# Patient Record
Sex: Female | Born: 1935 | Race: White | Hispanic: No | Marital: Single
Health system: Southern US, Community
[De-identification: ages and names within clinical notes are randomized; demographics above are authoritative.]

---

## 2012-12-30 ENCOUNTER — Inpatient Hospital Stay: Payer: Self-pay | Admitting: Internal Medicine

## 2012-12-30 LAB — CBC WITH DIFFERENTIAL/PLATELET
Eosinophil #: 0 10*3/uL (ref 0.0–0.7)
Eosinophil %: 0.2 %
HGB: 12.3 g/dL (ref 12.0–16.0)
Lymphocyte #: 0.6 10*3/uL — ABNORMAL LOW (ref 1.0–3.6)
MCH: 31.5 pg (ref 26.0–34.0)
Neutrophil #: 16.9 10*3/uL — ABNORMAL HIGH (ref 1.4–6.5)
Platelet: 255 10*3/uL (ref 150–440)
RBC: 3.9 10*6/uL (ref 3.80–5.20)
RDW: 13.9 % (ref 11.5–14.5)
WBC: 19 10*3/uL — ABNORMAL HIGH (ref 3.6–11.0)

## 2012-12-30 LAB — URINALYSIS, COMPLETE
Bilirubin,UR: NEGATIVE
Blood: NEGATIVE
Glucose,UR: NEGATIVE mg/dL (ref 0–75)
Leukocyte Esterase: NEGATIVE
Ph: 8 (ref 4.5–8.0)
Protein: NEGATIVE
RBC,UR: <1 /HPF (ref 0–5)
Specific Gravity: 1.009 (ref 1.003–1.030)

## 2012-12-30 LAB — BASIC METABOLIC PANEL
Anion Gap: 8 (ref 7–16)
Calcium, Total: 9.2 mg/dL (ref 8.5–10.1)
Chloride: 98 mmol/L (ref 98–107)
Co2: 28 mmol/L (ref 21–32)
EGFR (African American): >60
Glucose: 97 mg/dL (ref 65–99)
Osmolality: 268 (ref 275–301)
Potassium: 3.7 mmol/L (ref 3.5–5.1)

## 2012-12-31 LAB — CBC WITH DIFFERENTIAL/PLATELET
Basophil #: 0 10*3/uL (ref 0.0–0.1)
Basophil %: 0.4 %
Eosinophil #: 0.1 10*3/uL (ref 0.0–0.7)
Eosinophil %: 0.6 %
HCT: 30.8 % — ABNORMAL LOW (ref 35.0–47.0)
HGB: 10.7 g/dL — ABNORMAL LOW (ref 12.0–16.0)
Lymphocyte #: 1.4 10*3/uL (ref 1.0–3.6)
Lymphocyte %: 10.3 %
MCH: 32 pg (ref 26.0–34.0)
MCHC: 34.9 g/dL (ref 32.0–36.0)
MCV: 92 fL (ref 80–100)
Monocyte #: 1.7 x10 3/mm — ABNORMAL HIGH (ref 0.2–0.9)
Monocyte %: 12.4 %
Neutrophil #: 10.2 10*3/uL — ABNORMAL HIGH (ref 1.4–6.5)
Neutrophil %: 76.3 %
Platelet: 226 10*3/uL (ref 150–440)
RBC: 3.36 10*6/uL — ABNORMAL LOW (ref 3.80–5.20)
RDW: 13.6 % (ref 11.5–14.5)
WBC: 13.3 10*3/uL — ABNORMAL HIGH (ref 3.6–11.0)

## 2012-12-31 LAB — BASIC METABOLIC PANEL
Anion Gap: 6 — ABNORMAL LOW (ref 7–16)
Calcium, Total: 8.1 mg/dL — ABNORMAL LOW (ref 8.5–10.1)
Chloride: 101 mmol/L (ref 98–107)
EGFR (African American): >60
Glucose: 84 mg/dL (ref 65–99)
Osmolality: 267 (ref 275–301)
Potassium: 3.2 mmol/L — ABNORMAL LOW (ref 3.5–5.1)
Sodium: 134 mmol/L — ABNORMAL LOW (ref 136–145)

## 2013-01-05 LAB — CULTURE, BLOOD (SINGLE)

## 2013-03-19 LAB — URINALYSIS, COMPLETE
Ph: 6 (ref 4.5–8.0)
Specific Gravity: 1.014 (ref 1.003–1.030)

## 2013-03-19 LAB — COMPREHENSIVE METABOLIC PANEL
Albumin: 3.8 g/dL (ref 3.4–5.0)
Alkaline Phosphatase: 104 U/L (ref 50–136)
Anion Gap: 9 (ref 7–16)
Bilirubin,Total: 0.4 mg/dL (ref 0.2–1.0)
Calcium, Total: 9.1 mg/dL (ref 8.5–10.1)
Chloride: 97 mmol/L — ABNORMAL LOW (ref 98–107)
Co2: 27 mmol/L (ref 21–32)
Creatinine: 0.87 mg/dL (ref 0.60–1.30)
EGFR (African American): >60
Glucose: 117 mg/dL — ABNORMAL HIGH (ref 65–99)
Potassium: 3.9 mmol/L (ref 3.5–5.1)
SGOT(AST): 29 U/L (ref 15–37)
SGPT (ALT): 15 U/L (ref 12–78)
Sodium: 133 mmol/L — ABNORMAL LOW (ref 136–145)
Total Protein: 7.2 g/dL (ref 6.4–8.2)

## 2013-03-19 LAB — CBC WITH DIFFERENTIAL/PLATELET
Eosinophil #: 0.3 10*3/uL (ref 0.0–0.7)
HGB: 12.5 g/dL (ref 12.0–16.0)
MCH: 31.3 pg (ref 26.0–34.0)
MCHC: 34.5 g/dL (ref 32.0–36.0)
Monocyte #: 1 x10 3/mm — ABNORMAL HIGH (ref 0.2–0.9)
Neutrophil #: 10.2 10*3/uL — ABNORMAL HIGH (ref 1.4–6.5)
Neutrophil %: 77.7 %
RBC: 4.01 10*6/uL (ref 3.80–5.20)
RDW: 13.4 % (ref 11.5–14.5)

## 2013-03-19 LAB — PROTIME-INR
INR: 1
Prothrombin Time: 13.4 secs (ref 11.5–14.7)

## 2013-03-20 ENCOUNTER — Inpatient Hospital Stay: Payer: Self-pay | Admitting: Student

## 2013-03-20 LAB — CBC WITH DIFFERENTIAL/PLATELET
Basophil #: 0.1 10*3/uL (ref 0.0–0.1)
Basophil %: 0.6 %
Eosinophil %: 0.1 %
Lymphocyte %: 6.9 %
MCHC: 34.2 g/dL (ref 32.0–36.0)
MCV: 91 fL (ref 80–100)
Monocyte %: 5.7 %
Neutrophil #: 12 10*3/uL — ABNORMAL HIGH (ref 1.4–6.5)
Neutrophil %: 86.7 %

## 2013-03-20 LAB — BASIC METABOLIC PANEL
Anion Gap: 6 — ABNORMAL LOW (ref 7–16)
BUN: 12 mg/dL (ref 7–18)
Co2: 25 mmol/L (ref 21–32)
Creatinine: 0.77 mg/dL (ref 0.60–1.30)
EGFR (African American): >60
EGFR (Non-African Amer.): >60
Glucose: 146 mg/dL — ABNORMAL HIGH (ref 65–99)
Potassium: 4.3 mmol/L (ref 3.5–5.1)

## 2013-03-21 LAB — CBC WITH DIFFERENTIAL/PLATELET
Basophil %: 0.2 %
Basophil %: 0.4 %
Eosinophil %: 0.1 %
Eosinophil %: 0.2 %
HCT: 28.4 % — ABNORMAL LOW (ref 35.0–47.0)
HGB: 9 g/dL — ABNORMAL LOW (ref 12.0–16.0)
Lymphocyte #: 0.9 10*3/uL — ABNORMAL LOW (ref 1.0–3.6)
MCH: 31.4 pg (ref 26.0–34.0)
MCHC: 34.1 g/dL (ref 32.0–36.0)
Monocyte #: 1.6 x10 3/mm — ABNORMAL HIGH (ref 0.2–0.9)
Monocyte #: 1.8 x10 3/mm — ABNORMAL HIGH (ref 0.2–0.9)
Monocyte %: 13.1 %
Neutrophil #: 9 10*3/uL — ABNORMAL HIGH (ref 1.4–6.5)
Neutrophil %: 80.3 %
Platelet: 176 10*3/uL (ref 150–440)
RBC: 3.12 10*6/uL — ABNORMAL LOW (ref 3.80–5.20)
RDW: 13.7 % (ref 11.5–14.5)
WBC: 14.1 10*3/uL — ABNORMAL HIGH (ref 3.6–11.0)

## 2013-03-21 LAB — BASIC METABOLIC PANEL
Anion Gap: 5 — ABNORMAL LOW (ref 7–16)
Anion Gap: 6 — ABNORMAL LOW (ref 7–16)
BUN: 10 mg/dL (ref 7–18)
BUN: 11 mg/dL (ref 7–18)
Calcium, Total: 8 mg/dL — ABNORMAL LOW (ref 8.5–10.1)
Chloride: 98 mmol/L (ref 98–107)
Chloride: 99 mmol/L (ref 98–107)
Co2: 28 mmol/L (ref 21–32)
EGFR (African American): >60
EGFR (African American): >60
EGFR (Non-African Amer.): >60
Glucose: 114 mg/dL — ABNORMAL HIGH (ref 65–99)
Osmolality: 263 (ref 275–301)
Osmolality: 267 (ref 275–301)
Potassium: 3.8 mmol/L (ref 3.5–5.1)
Potassium: 4.3 mmol/L (ref 3.5–5.1)
Sodium: 131 mmol/L — ABNORMAL LOW (ref 136–145)
Sodium: 133 mmol/L — ABNORMAL LOW (ref 136–145)

## 2013-03-21 LAB — URINE CULTURE

## 2013-03-22 ENCOUNTER — Ambulatory Visit: Payer: Self-pay | Admitting: Internal Medicine

## 2013-03-22 LAB — CBC WITH DIFFERENTIAL/PLATELET
Basophil %: 0.3 %
Eosinophil #: 0 10*3/uL (ref 0.0–0.7)
Eosinophil %: 0.1 %
HCT: 25.1 % — ABNORMAL LOW (ref 35.0–47.0)
HGB: 8.5 g/dL — ABNORMAL LOW (ref 12.0–16.0)
Lymphocyte #: 0.6 10*3/uL — ABNORMAL LOW (ref 1.0–3.6)
Lymphocyte %: 4.3 %
MCH: 30.7 pg (ref 26.0–34.0)
MCHC: 34.1 g/dL (ref 32.0–36.0)
Monocyte #: 1.8 x10 3/mm — ABNORMAL HIGH (ref 0.2–0.9)
Neutrophil #: 11.6 10*3/uL — ABNORMAL HIGH (ref 1.4–6.5)
Neutrophil %: 82.2 %
RBC: 2.79 10*6/uL — ABNORMAL LOW (ref 3.80–5.20)

## 2013-03-22 LAB — HEMOGLOBIN: HGB: 8.6 g/dL — ABNORMAL LOW (ref 12.0–16.0)

## 2013-03-23 LAB — BASIC METABOLIC PANEL
BUN: 12 mg/dL (ref 7–18)
Calcium, Total: 7.7 mg/dL — ABNORMAL LOW (ref 8.5–10.1)
Chloride: 94 mmol/L — ABNORMAL LOW (ref 98–107)
Creatinine: 0.83 mg/dL (ref 0.60–1.30)
EGFR (African American): >60
EGFR (Non-African Amer.): >60
Glucose: 137 mg/dL — ABNORMAL HIGH (ref 65–99)
Potassium: 2.9 mmol/L — ABNORMAL LOW (ref 3.5–5.1)
Sodium: 127 mmol/L — ABNORMAL LOW (ref 136–145)

## 2013-03-23 LAB — CBC WITH DIFFERENTIAL/PLATELET
Basophil #: 0 10*3/uL (ref 0.0–0.1)
Basophil %: 0.2 %
Eosinophil %: 1.3 %
Lymphocyte #: 1.4 10*3/uL (ref 1.0–3.6)
Monocyte #: 2 x10 3/mm — ABNORMAL HIGH (ref 0.2–0.9)
Monocyte %: 17.1 %
Neutrophil #: 8.1 10*3/uL — ABNORMAL HIGH (ref 1.4–6.5)
Neutrophil %: 69.2 %
Platelet: 152 10*3/uL (ref 150–440)
RBC: 2.51 10*6/uL — ABNORMAL LOW (ref 3.80–5.20)
RDW: 13.4 % (ref 11.5–14.5)

## 2013-03-24 LAB — BASIC METABOLIC PANEL
Calcium, Total: 7.9 mg/dL — ABNORMAL LOW (ref 8.5–10.1)
Chloride: 95 mmol/L — ABNORMAL LOW (ref 98–107)
Co2: 27 mmol/L (ref 21–32)
Creatinine: 0.79 mg/dL (ref 0.60–1.30)
Glucose: 107 mg/dL — ABNORMAL HIGH (ref 65–99)
Potassium: 3.3 mmol/L — ABNORMAL LOW (ref 3.5–5.1)
Sodium: 128 mmol/L — ABNORMAL LOW (ref 136–145)

## 2013-03-24 LAB — CBC WITH DIFFERENTIAL/PLATELET
Basophil %: 0.5 %
Eosinophil #: 0.3 10*3/uL (ref 0.0–0.7)
HCT: 23.6 % — ABNORMAL LOW (ref 35.0–47.0)
Lymphocyte #: 1.2 10*3/uL (ref 1.0–3.6)
MCH: 31.5 pg (ref 26.0–34.0)
MCHC: 34.6 g/dL (ref 32.0–36.0)
Monocyte #: 1.8 x10 3/mm — ABNORMAL HIGH (ref 0.2–0.9)
Monocyte %: 15.8 %
Neutrophil %: 70.8 %
Platelet: 209 10*3/uL (ref 150–440)
RBC: 2.6 10*6/uL — ABNORMAL LOW (ref 3.80–5.20)
RDW: 13.5 % (ref 11.5–14.5)
WBC: 11.2 10*3/uL — ABNORMAL HIGH (ref 3.6–11.0)

## 2013-03-25 LAB — CBC WITH DIFFERENTIAL/PLATELET
Eosinophil #: 0.4 10*3/uL (ref 0.0–0.7)
Eosinophil %: 4.6 %
HCT: 22.6 % — ABNORMAL LOW (ref 35.0–47.0)
Lymphocyte #: 1.5 10*3/uL (ref 1.0–3.6)
Monocyte #: 1.3 x10 3/mm — ABNORMAL HIGH (ref 0.2–0.9)
Monocyte %: 13.5 %
Neutrophil %: 65.5 %
Platelet: 232 10*3/uL (ref 150–440)
WBC: 9.7 10*3/uL (ref 3.6–11.0)

## 2013-03-25 LAB — BASIC METABOLIC PANEL
Anion Gap: 5 — ABNORMAL LOW (ref 7–16)
BUN: 10 mg/dL (ref 7–18)
EGFR (African American): >60
EGFR (Non-African Amer.): >60
Osmolality: 267 (ref 275–301)
Potassium: 3.6 mmol/L (ref 3.5–5.1)

## 2013-03-27 ENCOUNTER — Ambulatory Visit: Payer: Self-pay | Admitting: Internal Medicine

## 2013-03-27 LAB — CULTURE, BLOOD (SINGLE)

## 2013-09-11 ENCOUNTER — Emergency Department: Payer: Self-pay | Admitting: Emergency Medicine

## 2013-09-11 LAB — CBC WITH DIFFERENTIAL/PLATELET
Basophil #: 0 10*3/uL (ref 0.0–0.1)
Basophil %: 0.3 %
Eosinophil #: 0 10*3/uL (ref 0.0–0.7)
Eosinophil %: 0 %
HCT: 39.1 % (ref 35.0–47.0)
HGB: 12.8 g/dL (ref 12.0–16.0)
LYMPHS ABS: 0.8 10*3/uL — AB (ref 1.0–3.6)
LYMPHS PCT: 5.1 %
MCH: 31 pg (ref 26.0–34.0)
MCHC: 32.7 g/dL (ref 32.0–36.0)
MCV: 95 fL (ref 80–100)
Monocyte #: 2 x10 3/mm — ABNORMAL HIGH (ref 0.2–0.9)
Monocyte %: 12.9 %
Neutrophil #: 12.8 10*3/uL — ABNORMAL HIGH (ref 1.4–6.5)
Neutrophil %: 81.7 %
Platelet: 222 10*3/uL (ref 150–440)
RBC: 4.12 10*6/uL (ref 3.80–5.20)
RDW: 13.9 % (ref 11.5–14.5)
WBC: 15.6 10*3/uL — AB (ref 3.6–11.0)

## 2013-09-11 LAB — COMPREHENSIVE METABOLIC PANEL
Albumin: 3.4 g/dL (ref 3.4–5.0)
Alkaline Phosphatase: 79 U/L
Anion Gap: 7 (ref 7–16)
BUN: 16 mg/dL (ref 7–18)
Bilirubin,Total: 0.8 mg/dL (ref 0.2–1.0)
CHLORIDE: 101 mmol/L (ref 98–107)
CREATININE: 0.76 mg/dL (ref 0.60–1.30)
Calcium, Total: 9.2 mg/dL (ref 8.5–10.1)
Co2: 29 mmol/L (ref 21–32)
EGFR (Non-African Amer.): >60
Glucose: 114 mg/dL — ABNORMAL HIGH (ref 65–99)
Osmolality: 276 (ref 275–301)
Potassium: 3.5 mmol/L (ref 3.5–5.1)
SGOT(AST): 22 U/L (ref 15–37)
SGPT (ALT): 11 U/L — ABNORMAL LOW (ref 12–78)
Sodium: 137 mmol/L (ref 136–145)
TOTAL PROTEIN: 7.6 g/dL (ref 6.4–8.2)

## 2013-09-16 LAB — CULTURE, BLOOD (SINGLE)

## 2014-04-21 IMAGING — CT CT CERVICAL SPINE WITHOUT CONTRAST
1 series · 12 of 14 positions shown, 15 images · non-contrast
Comparison: none

REASON FOR EXAM: Fall
COMMENTS:

PROCEDURE:     CT  - CT CERVICAL SPINE WO  - March 19, 2013  [DATE]
RESULT:     CT cervical spine dated 03/19/2013
TECHNIQUE: Multiplanar imaging of the cervical spine is obtained utilizing
helical 2 mm acquisition and high-definition bone reconstruction algorithm.

[Series 4: axial · axial · 0.33mm/px · z∈[+319,+451]mm · 12 of 90 slices shown, 15 images]
[im 7/90  soft-tissue]
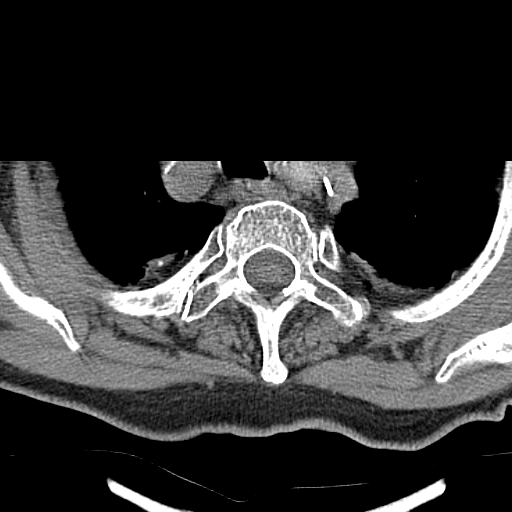
[im 7/90  bone]
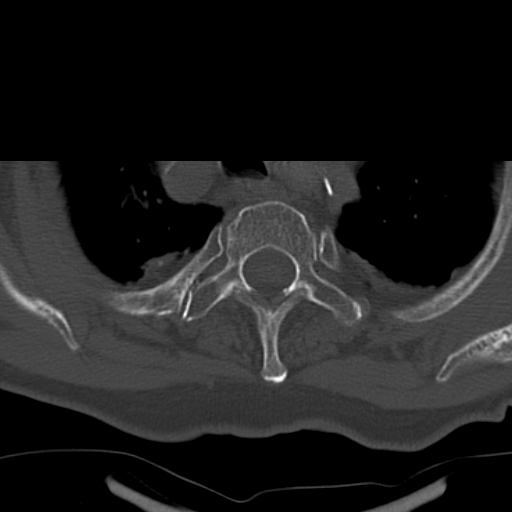
[im 14/90  bone]
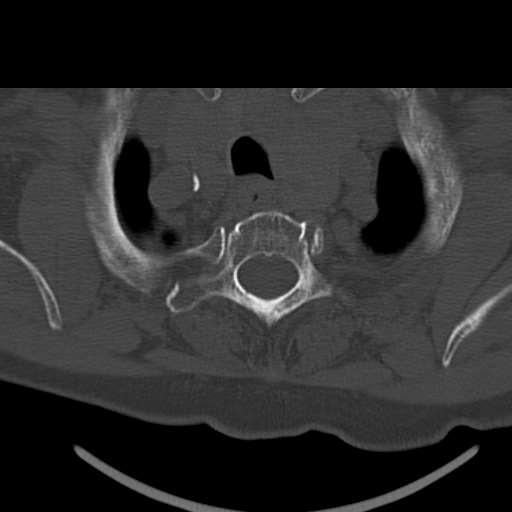
[im 21/90  bone]
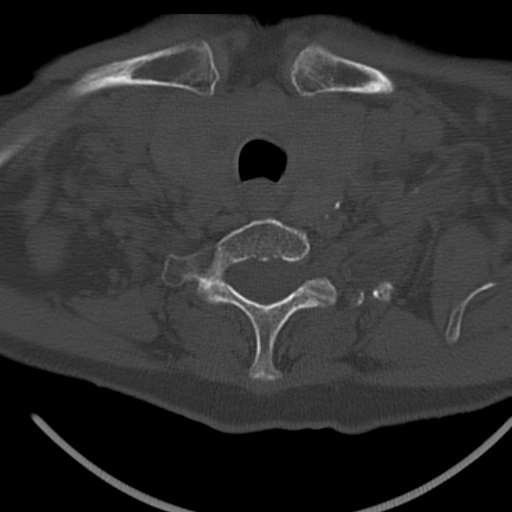
[im 28/90  bone]
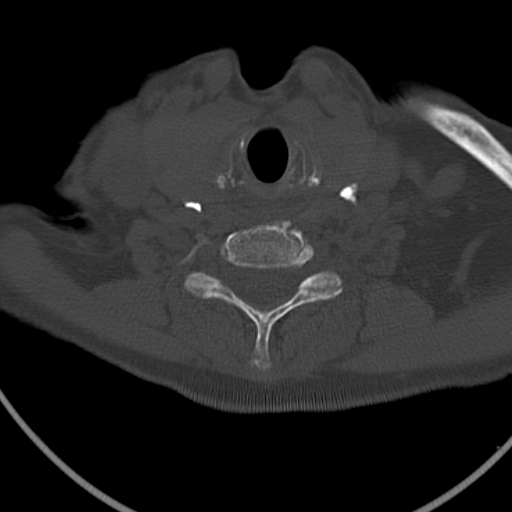
[im 35/90  soft-tissue]
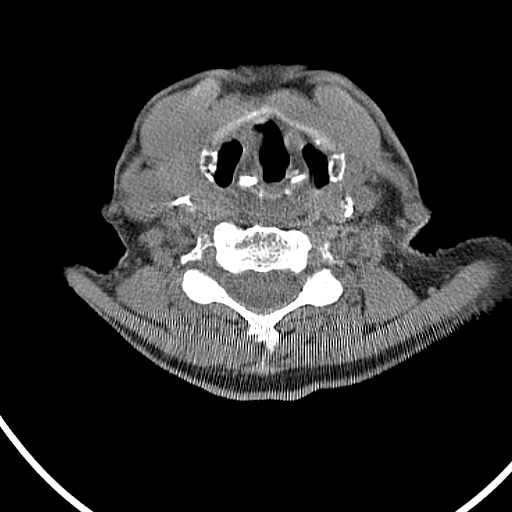
[im 35/90  bone]
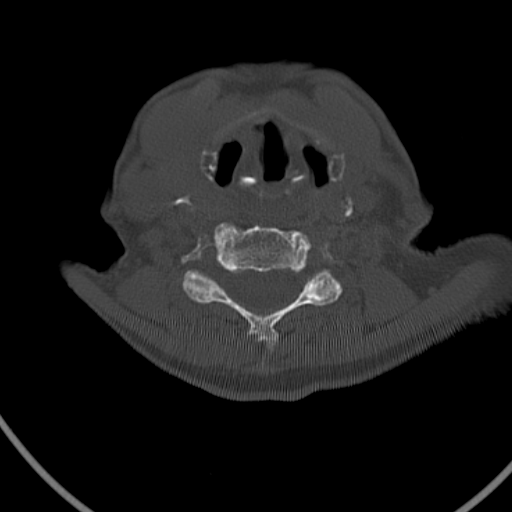
[im 42/90  bone]
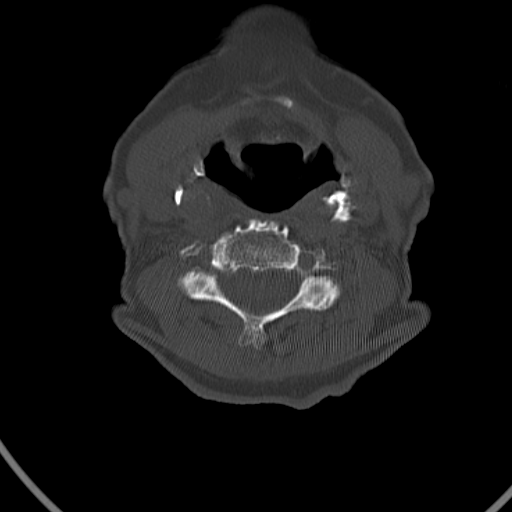
[im 48/90  bone]
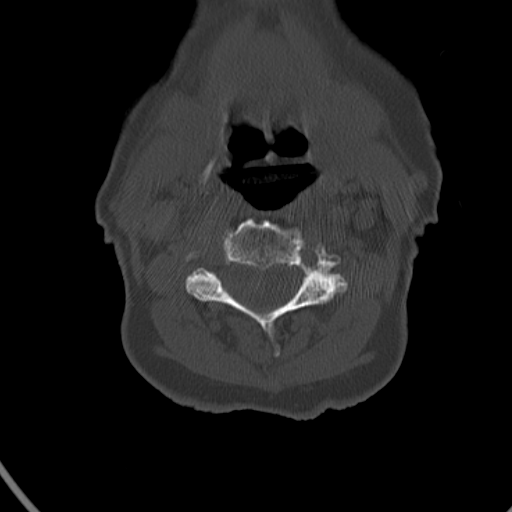
[im 55/90  bone]
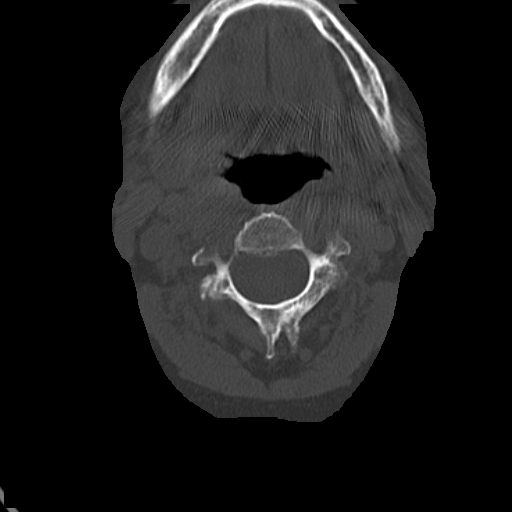
[im 62/90  soft-tissue]
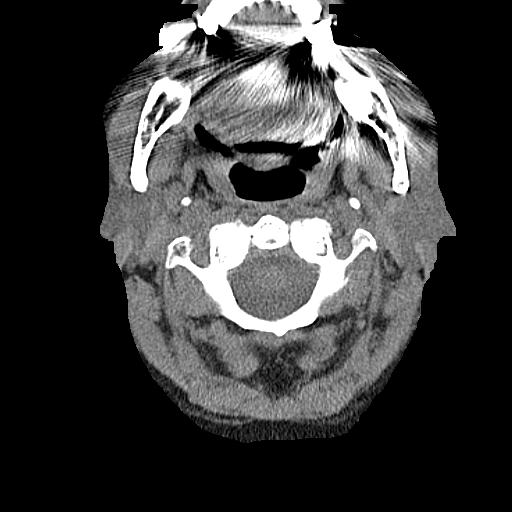
[im 62/90  bone]
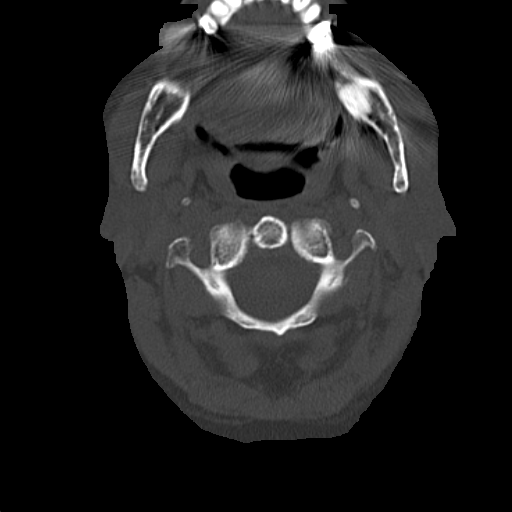
[im 69/90  bone]
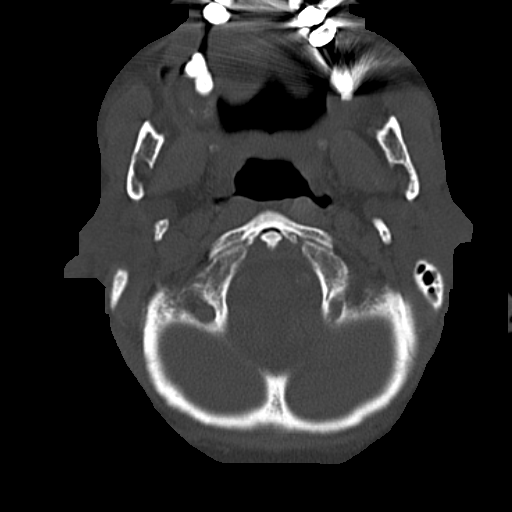
[im 76/90  bone]
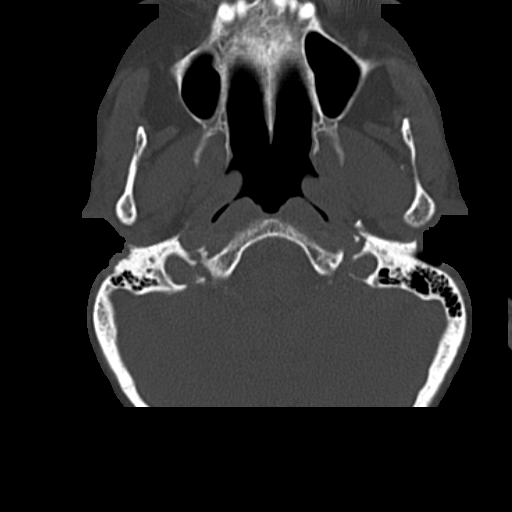
[im 83/90  bone]
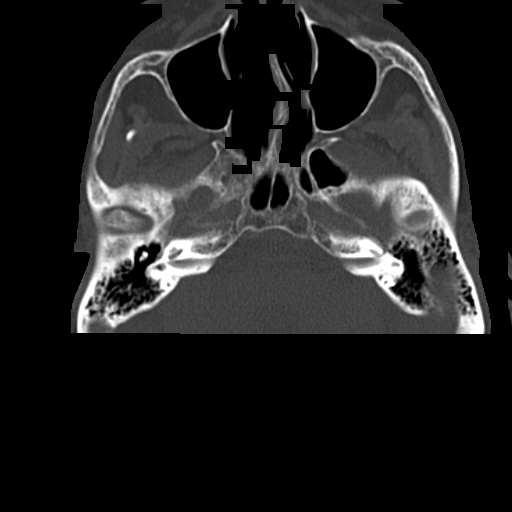

[12 of 14 positions shown; findings below may reference images not displayed]

FINDINGS: There is mild reversal of the normal cervical lordosis. There is
no evidence of fracture nor dislocation. Multilevel degenerative disc
disease changes are evident. This results in areas of mild anterolisthesis
common disc base narrowing, subchondral sclerosis, and endplate hypertrophic
spurring. There is no evidence of canal stenosis.
IMPRESSION: No evidence of acute osseous abnormalities.
2. Dr. Henrietta of the emergency department was informed of these
findings via a preliminary faxed report.

## 2014-09-16 NOTE — Consult Note (Signed)
Brief Consult Note: Diagnosis: Left intertrochanteric hip fracture.   Patient was seen by consultant.   Recommend to proceed with surgery or procedure.   Recommend further assessment or treatment.   Orders entered.   Comments: 81 year old female apparantly fell from her wheelchair yesterday at Memory Care unit injuring the left hip.  Brought to Emergency Room where exam and X-rays show a displeced, comminuted, left intertrochanteric hip fracture.  She has advanced dementia but overall health is good and she walks and eats well.  Discussed treatment with daughter who wishes to proceed with surgical fixation of the fracture for comfort and nursing care reasons.  Risks and benefits of surgery were discussed at length including but not limited to infection, non union, nerve or blood vessed damage, non union, need for repeat surgery, blood clots and lung emboli, and death.  Plan surgery today as she has been cleared by medical service.    Exam:  Sleeping at present but arousable.  circulation/sensation/motor function good left leg.  Rotated and shortened. skin intact.  No other skeletal pains noted.    X-rays: as above  Rx:  open reduction and internal fixation left hip with Trochanteric Fixation Nail device.  Electronic Signatures: Valinda HoarMiller, Juliet Vasbinder E (MD)  (Signed 25-Oct-14 12:09)  Authored: Brief Consult Note   Last Updated: 25-Oct-14 12:09 by Valinda HoarMiller, Ranell Skibinski E (MD)

## 2014-09-16 NOTE — Discharge Summary (Signed)
PATIENT NAME:  Madeline Cruz, Madeline Cruz MR#:  [id number] DATE OF BIRTH:  Apr 30, 1920  DATE OF ADMISSION:  12/30/2012 DATE OF DISCHARGE:  01/01/2013  PRESENTING COMPLAINT: Altered mental status.   DISCHARGE DIAGNOSES: 1.  Acute encephalopathy secondary to recurrent urinary tract infection. 2.  Advanced dementia.   CONDITION ON DISCHARGE: Fair.   CODE STATUS: NO CODE, DO NOT RESUSCITATE.   MEDICATIONS: 1.  Bisacodyl 5 mg p.o. daily as needed.  2.  APAP 325 mg 2 tablets 4 times a day as needed.  3.  Aspirin 81 mg daily.  4.  Multivitamin p.o. daily.  5.  Cranberry tablets, 1 tablet b.i.d.  6.  Bisacodyl 10 mg rectal suppository as needed.  7.  Augmentin 500 b.i.d.   FOLLOWUP:  With your primary care physician in 1 to 2 weeks.   LABORATORY DATA:  White count on discharge were 13.3, hemoglobin and hematocrit is 10.7 and 30.8. Basic metabolic panel within normal limits except potassium of 3.2 and sodium is 134.   Chest x-Ray: No acute disease of the chest.   UA negative for UTI.   Blood cultures negative in 18 to 24 hours.  urine culture positive for Strep agalactiae.    BRIEF SUMMARY OF HOSPITAL COURSE:  Madeline Cruz is a 81 year old Caucasian female, resident of [facility] who comes to the Emergency Room after she was found to have altered mental status. She was admitted with:  1.  Altered mental status/acute encephalopathy likely due to recurrent UTI. The patient was started on IV antibiotics. Her urine culture was positive for Strep agalactiae and hence her  antibiotics were changed to p.o. Augmentin. She did not have any fever. White count was trending down. 2.  COPD/emphysema. Sats were 90% to 93% on room air. No respiratory distress. The patient is an ex-heavy smoker.  3.  Advanced dementia. The patient remained stable.  4.  The patient overall improved. [facility].  CODE STATUS: NO CODE, DO NOT RESUSCITATE    ____________________________ Wylie HailSona A.  Allena KatzPatel, MD sap:dp D: 01/02/2013 06:57:52 ET T: 01/02/2013 07:12:51 ET JOB#: [id number]  cc: Tarissa Kerin A. Allena KatzPatel, MD, <Dictator> Willow OraSONA A Hondo Nanda MD ELECTRONICALLY SIGNED 01/15/2013 5:40

## 2014-09-16 NOTE — Discharge Summary (Signed)
PATIENT NAME:  Madeline Cruz, Madeline Cruz MR#:  [id number] DATE OF BIRTH:  05-13-1920  DATE OF ADMISSION:  03/20/2013 DATE OF DISCHARGE:  10/302014  CONSULTANTS: Dr. Hyacinth MeekerMiller from orthopedics and palliative care.   PRIMARY CARE PHYSICIAN: DrMarland Kitchen.. Leona CarryJonathan Cline.   CHIEF COMPLAINT: Status post fall and left hip fracture.   DISCHARGE DIAGNOSES: 1.  Status post fall and left hip fracture, status post repair.  2.  Deep vein thrombosis.  3. Hypoxia, likely from underlying suspected chronic obstructive pulmonary disease and pulmonary fibrosis.  4.  Advanced dementia.  5.  Hypokalemia.  6.  History of hyponatremia. 7.  History of recurrent urinary tract infections.  8.  Failure to thrive.  9.  Macular degeneration.  10. Glaucoma.   DISCHARGE MEDICATIONS: Bisacodyl 5 mg 1 tab once a day as needed for constipation, Mapap 650 mg every 4 hours as needed for pain, Tab-A-Vite multiple vitamins once a day, acetaminophen 500 mg 2 times a day for a week, Xarelto 15 mg 2 times a day with meals for 21 days, then go to 20 mg daily dose, calcium/vitamin D 500/200 units 1 tab 2 times a day with meals, ferrous sulfate 325 mg 2 times a day with meals.   DIET: Low sodium.   ACTIVITY: As tolerated.   Please follow with PCP and orthopedics within 1 to 2 weeks and check kidney function periodically while on the blood thinner.   SIGNIFICANT LABS AND IMAGING: Initial BUN 13, creatinine 0.87, sodium 133. LFTs on arrival within normal limits. Initial WBC 13.2, last WBC of 9.7. Initial hemoglobin 12.5, last hemoglobin 8. Last platelets 232. Blood cultures from October 27: No growth to date. Urine cultures from October 24: No growth. UA from October 24:  No growth.  UA on October 24 did not suggest an infection. CT of head without contrast status post fall showed chronic and involutional changes without evidence of acute abnormalities, and CT of cervical spine showed no evidence of acute osseous abnormality. X-ray chest, October 26:  COPD without evidence of acute cardiopulmonary disease. There is prominence of interstitial markings, likely reflecting component of pulmonary fibrosis. X-ray of the left femur on day of admission showed comminuted intratrochanteric fracture. Pelvis AP on October 24th showing left intratrochanteric fracture, finding concerning for superior and inferior pubic rami fractures. Lumbar spine, AP and lateral, on day of admission: No evidence of acute osseous abnormality. Chest, 1 view, showed prominent interstitial markings, likely reflecting pulmonary fibrosis.   HISTORY OF PRESENT ILLNESS AND HOSPITAL COURSE: For full details of H and P, please see the dictation on October 25 by Dr. Randol KernElgergawy, but briefly, this is a 81 year old with advanced dementia who usually walks with a wheelchair, occasionally with a walker, [facility] [place]. She sustained a fall, fell on the floor, and it was unwitnessed. She came into the hospital where she had a CT of the head, which was negative for stroke, but it was noted with hip fracture. She was admitted to the hospitalist service and orthopedics was consulted. The patient also was noted to have low oxygenation, as low as high 80s on room air, but she was not in any respiratory distress and had x-ray of the chest negative for any acute events. She underwent successful surgery by Dr. Hyacinth MeekerMiller on October 25th. She underwent a nailing and pinning on the left side. She did spike a temperature postop and it was worked up. The patient had an x-ray of the chest. Urine cultures and  blood cultures were all negative, and empirically was also started on vancomycin and Zosyn, and also had lower extremity Doppler, which, unfortunately, did show DVT on the right side, but the patient would not let us check the left side. Nonetheless, she was started on therapeutic doses of Lovenox. She had already been on prophylactic dose post hip fracture. The risks and benefits  of anticoagulation was discussed with Pam, her daughter, and ultimately she was discharged on Xarelto.  In regards to hypoxemia, she did require oxygen off and on. Currently, she is off. I believe that hypoxemia is all chronic, as the x-rays of the chest are negative for pneumonia or CHF, but did show evidence for COPD and pulmonary fibrosis, and the patient was a long-time smoker. After the DVT was found, a CT of the chest was offered to the family, but they did not want her to undergo the CAT scan, as ultimately the treatment for the blood clot is the same. They are aware that she could bleed on the blood thinner, but given the DVT found, we will treat. She did have bouts of hypokalemia, and they were repleted. In regards to hip fracture, she will be following with orthopedics as an outpatient.   DISCHARGE PHYSICAL EXAMINATION: VITAL SIGNS:  On the day of discharge, temperature was 98.1, pulse 101, respiratory rate 18, blood pressure 143/72. O2 sat 90% on room air.  GENERAL:  The patient is a frail, elderly female, sitting on a chair, no obvious distress. HEENT: Normocephalic, atraumatic. Moist mucous membranes.   NECK:  Supple. CARDIOVASCULAR: S1, S2, irregularly irregular.  LUNGS: Clear anteriorly, but the patient has poor effort.  ABDOMEN: The patient has no significant tenderness on abdomen.  EXTREMITIES:  The patient has some swelling and edema on the left thigh with bandages intact.  NEUROLOGIC:  She does follows simple commands, but she is not oriented to place or time. She can sometimes recognize her family.   At this time, she will be discharged to rehab and will need to be following with orthopedics and PCP upon discharge.   The patient is DO NOT RESUSCITATE.   TOTAL TIME SPENT: 40 minutes   ____________________________ Krystal Eaton, MD sa:dmm D: 03/25/2013 11:52:00 ET T: 03/25/2013 12:22:17 ET JOB#: [id number]  cc: Krystal Eaton, MD, <Dictator> Leona Carry, MD Krystal Eaton MD ELECTRONICALLY SIGNED 04/15/2013 2:59

## 2014-09-16 NOTE — H&P (Signed)
PATIENT NAME:  Madeline Cruz, ANDRES MR#:  [id number] DATE OF BIRTH:  1920/01/06  DATE OF ADMISSION:  12/30/2012  PRIMARY CARE PROVIDER: None local.  EMERGENCY DEPARTMENT REFERRING PHYSICIAN: Su Ley, MD  CHIEF COMPLAINT: Hypoxia, elevated WBC count.   HISTORY OF PRESENT ILLNESS: The patient is a 81 year old nursing home resident who is sent in due to hypoxia, worsening mental status as well as fever. The patient has advanced dementia and is unable to provide any history. I called her daughter, who was able to provide me with information. She reports that she was recently living with her other daughter until recently and then had to be admitted at [facility] for a urinary tract infection. Subsequently, she has been discharged to the rehab facility. According to her, her mother has advanced dementia and is normally very confused. She otherwise does not have any medical problems except for recurrent UTIs and the dementia. She reports that her mother at her knowledge has been eating and drinking okay without any difficulties.   PAST MEDICAL HISTORY: Significant for: 1.  Recurrent UTIs.  2.  History of advanced dementia.   PAST SURGICAL HISTORY: Status post appendectomy.   ALLERGIES: None.   CURRENT MEDICATIONS: She is on aspirin 81 mg 1 tab p.o. daily, Bisac-Evac 10 mg rectal suppositories as needed, bisacodyl 5 mg 1 tab p.o. daily as needed, cranberry 1 tab p.o. b.i.d., Tylenol 650 q.4 p.r.n. for pain,  1 tab p.o. daily.   SOCIAL HISTORY: History of smoking, quit at age 28. No smoking, no alcohol according to her daughter.  REVIEW OF SYSTEMS: Unobtainable due to patient's advanced dementia.   PHYSICAL EXAMINATION: VITAL SIGNS: Temperature 100.9, pulse 103, respiratory rate 24, blood pressure 177/133. O2 sat was 88% on room air.  GENERAL: The patient is an elderly female in no acute distress.  HEENT: Head atraumatic, normocephalic. Pupils equally round and reactive to light and accommodation.  Extraocular ocular movements are intact. There is no conjunctival pallor. No scleral icterus. Nasal exam shows no drainage or ulceration. Oropharynx is clear without any exudates.  NECK: Supple. No JVD. No carotid bruits.  CARDIOVASCULAR: Regular rate and rhythm. No murmurs, rubs, clicks or gallops. PMI is not displaced.  LUNGS: Diminished breath sounds bilaterally without any rales, rhonchi, wheezing.  ABDOMEN: Soft, nontender, nondistended. Positive bowel sounds x 4.  EXTREMITIES: No clubbing, cyanosis, edema.  SKIN: No rash.  LYMPHATICS: No lymph nodes palpable. VASCULAR: Good DP, PT pulses.  PSYCHIATRIC: The patient is confused. Not oriented to place, person or time. Not agitated.  NEUROLOGICAL: Spontaneously moving all extremities. Limited exam.  LABORATORY AND RADIOLOGICAL DATA: Glucose 97, BUN 11, creatinine 0.72, sodium 134, potassium 3.7, chloride 98, CO2 is 28, calcium 9.2. Troponin 0.03. WBC 19.0, hemoglobin 12.3, platelet count 255. Chest x-ray shows no acute cardiopulmonary processes, according to radiology. There is infiltrate noted on the chest x-ray per my interpretation.   ASSESSMENT AND PLAN: The patient is a 81 year old with advanced dementia who is sent in for some hypoxia and leukocytosis.  1.  Possible pneumonia, although x-ray per radiology is negative. She was noted to have hypoxia, elevated WBC count at this time. We will go ahead and treat her with intravenous Levaquin. If no improvement, then we will change her to healthcare-associated pneumonia treatment. The patient currently appears stable and I will not treat that with triple antibiotics at this point.  2.  Advanced dementia.  3.  Leukocytosis due to #1. Will treat her. Get blood cultures, urine cultures.  4.  Code status discussed with the patient's daughter. She is a DO NOT RESUSCITATE. She also has a yellow form with her.   TIME SPENT: Please note, 45 minutes spent on this H and P.      ____________________________ Lacie ScottsShreyang H. Allena KatzPatel, MD shp:jm D: 12/30/2012 16:33:03 ET T: 12/30/2012 17:22:00 ET JOB#: [id number]  cc: Emanuel Campos H. Allena KatzPatel, MD, <Dictator> Charise CarwinSHREYANG H Sharlene Mccluskey MD ELECTRONICALLY SIGNED 01/04/2013 10:28

## 2014-09-16 NOTE — Op Note (Signed)
PATIENT NAME:  Madeline Cruz, Madeline Cruz MR#:  [id number] DATE OF BIRTH:  [birth year]  DATE OF PROCEDURE:  03/20/2013  PREOPERATIVE DIAGNOSES: Comminuted, displaced intertrochanteric fracture of the left hip.   POSTOPERATIVE DIAGNOSES: Comminuted, displaced intertrochanteric fracture of the left hip.   PROCEDURE PERFORMED: Open reduction, internal fixation, left hip with a [employer] trochanteric fixation nail device (130 degrees/11 mm rod, 90 mm helical blade, and 34 mm distal screw).   SURGEON: Valinda HoarHoward E. Timiko Offutt, M.D.   ANESTHESIA: Spinal.  COMPLICATIONS: None.   DRAINS: None.   ESTIMATED BLOOD LOSS: 100 mL   REPLACEMENT: None.   DESCRIPTION OF PROCEDURE: The patient was brought to the Operating Room where she underwent spinal anesthesia and was placed supine on the fracture table. The right leg was flexed and abducted, and the left leg was placed in traction and internally rotated. Fluoroscopy showed excellent positioning of the fracture fragments.   The hip was prepped and draped in sterile fashion, and a longitudinal incision made just above the trochanter. Dissection was carried out sharply through subcutaneous tissue and fascia. A guidepin was inserted and a large drill was used to create an opening for the TFN nail. A guidepin was passed down the shaft, and the 130 degree, 11 mm rod was inserted. It would not seat fully, and so was removed. The shaft was then reamed with flexible reamers up to 13 mm.  The rod was then reinserted over the long guide and seated fully. The guide was removed. The stab wound was made distally, and the aiming device for the guidepin was inserted and brought up against bone. The guidepin was inserted until it seemed to be in excellent position on AP and lateral view in the femoral head. This measured 90 mm. The lateral cortex was drilled, and the long drill for the spiral blade was inserted to 90 mm.  The 90 mm helical blade was then inserted and seated fully.  Fluoroscopy showed it to be in good position. The set screw was tightened from above after traction was released. The distal screw aiming guide was inserted through another and stab wound, and after drilling a hole, the 34 mm screw was inserted snugly. The outrigger was removed, and hardware and fracture fragments were seen to be in good position.   The wounds were irrigated. The fascia was closed with 0 Vicryl suture. The subcutaneous tissue was closed with 2-0 Vicryl, and the skin was closed with staples. Dry sterile dressings were applied. The patient was taken out of traction and placed in her hospital bed. The hip was stable, and alignment was good. She was taken to recovery in good condition.   ____________________________ Valinda HoarHoward E. Javed Cotto, MD hem:cg D: 03/20/2013 18:43:22 ET T: 03/21/2013 00:07:42 ET JOB#: [id number]  cc: Valinda HoarHoward E. Zandrea Kenealy, MD, <Dictator> Valinda HoarHOWARD E Sanika Brosious MD ELECTRONICALLY SIGNED 03/21/2013 9:04

## 2014-09-16 NOTE — H&P (Signed)
PATIENT NAME:  Madeline Cruz, Madeline Cruz MR#:  [id number] DATE OF BIRTH:  [birth year]  DATE OF ADMISSION:  03/20/2013  REFERRING PHYSICIAN:  Harvest Dark, M.D.   PRIMARY CARE PHYSICIAN: Laurin Coder, M.D.   CHIEF COMPLAINT: Status post fall with left hip fracture.   HISTORY OF PRESENT ILLNESS: This is a 81 year old female with history of advanced dementia and ambulatory dysfunction, usually ambulates with a wheelchair, occasionally with a walker. Lives at assisted living facility at [place]. The patient had a fall this evening. The patient was found on the floor, she had an unwitnessed fall, it appears it happened while she was trying to get into her wheelchair. The patient has advanced dementia and cannot give any reliable history. In the ED, the patient had CT head and CT cervical spine which did not show any acute findings. The patient's x-ray did show left hip fracture.  ED physician discussed with orthopedics who requested patient be admitted to hospitalist service with possible need for surgical repair in a.m. The patient's vital signs were within normal limits upon presentation. She was afebrile, she was noticed to be mildly hypoxic with saturating between 93 to 89 on room air. A daughter reports her mom had diagnosis of bronchitis in the past and was not aware of finding of COPD, but she was chronically smoking, she quit smoking at the age of 85. The patient does not have a significant UTI, but her urinalysis came back clean today. The patient last EKG in August of this year did show A. fib with RVR with right bundle branch block. Repeat EKG today did show mild sinus tachycardia, but was sinus rhythm with occasional sinus arrhythmias with the old right bundle branch block. As mentioned earlier, patient cannot give a reliable review of systems, but she did deny having any chest pain or shortness of breath when asked.   PAST MEDICAL HISTORY:  1.  Advanced dementia.  2.  Hyponatremia.   3.  Recurrent urinary tract infections.  4.  Failure to thrive.  5.  Macular degeneration.  6.  Glaucoma.   PAST SURGICAL HISTORY:  Status post appendectomy.   ALLERGIES: No known drug allergies.   SOCIAL HISTORY: The patient quit smoking at age of 4. Currently a nonsmoker. No alcohol abuse.   FAMILY HISTORY: Daughter reports there is no family history of coronary artery disease at young age.   REVIEW OF SYSTEMS:  Unobtainable in this patient due to her advanced dementia.   HOME MEDICATIONS: 1.  Bisacodyl 5 mg suppository as needed.  2.  Tylenol as needed.  3.  Patient recently finished p.o. Cipro course 250 oral twice a day for a UTI.   4.  Aspirin 81 mg oral daily.  5.  Multivitamin 1 tablet daily.  6.  Cranberry 400 mg twice a day.   PHYSICAL EXAMINATION: VITAL SIGNS: Temperature 98.7, pulse 91, respiratory rate 12, blood pressure 142/61, saturating at 89% on room air.  GENERAL: Elderly female, lies comfortable and in no apparent distress.  HEENT: Head atraumatic, normocephalic. Pupils equal, reactive to light. Pink conjunctivae and anicteric sclerae. Dry oral mucosa.  NECK: Supple. No thyromegaly. No JVD.  LUNGS: Has good air entry bilaterally. No wheezing, rales or rhonchi.  CARDIOVASCULAR: S1, S2 heard. No rubs, murmurs or gallops, regular rhythm.  ABDOMEN: Soft, nontender, nondistended. Bowel sounds present.  EXTREMITIES: No edema. No clubbing. No cyanosis. Dorsalis pedis pedal pulses were felt bilaterally, slightly diminished. No evidence of ischemia. Left lower extremity slightly shorter  than the right.  PSYCHIATRIC: Unable to evaluate as the patient has advanced dementia, unable to answer to her name.  NEUROLOGIC: Appears to be grossly intact. Moving all extremities without significant deficits.  LYMPHATIC: No cervical, supraclavicular lymphadenopathy.  SKIN: Warm and dry, normal skin turgor.   PERTINENT LABORATORY DATA: Glucose 117, BUN 13, creatinine 0.87, sodium  133, potassium 3.9, chloride 97, CO2 27, ALT 15, AST 29, alk phos 104. White blood cells 13.2, hemoglobin 12.5, hematocrit 36.4, platelets 229, INR 1. Urinalysis negative for leukocyte esterase and nitrite. EKG showing normal sinus rhythm with occasional supraventricular arrhythmias at 98 beats per minute with right bundle branch block, which appears to be old when  compared to last EKG in August of this year.   IMAGING STUDIES: CT cervical spine showed no acute fracture or dislocation and degenerative disk disease and facet arthropathy. CT head without contrast showing no acute  intracranial hemorrhage, mass, or mass effect or definite acute large territorial infarct.   ASSESSMENT AND PLAN: 1.  Preop evaluation for left hip fracture. The patient does not appear to be in congestive heart failure. Does not appear to be having any malignant arrhythmias, even though she is having some mild sinus tachycardia with mild sinus arrhythmias, but rate appeared to be controlled. This is most likely related to her pain. We will give her 250 mL fluid bolus x 1. The patient is considered moderate risk for surgical intervention due to her advanced age. So, at this point, would recommend proceeding with surgery, especially given the fact if patient does not have surgery this have high mortality rate. Will start patient on DuoNebs due to the fact she has possible history of chronic obstructive pulmonary disease and she has mild hypoxia, as well we will keep her on oxygen as well. Will continue to hold her aspirin.  2.  Advanced dementia. Continue with supportive care.  3.  Leukocytosis. The patient is afebrile, does not appear to be having any infections, recently finished p.o. Cipro for her urinary tract infection, this is most likely due to stress from her fracture.  4.  Deep vein thrombosis prophylaxis, currently sequential compression device and TED hose. Can be started on chemical anticoagulation after the surgery.  5.   CODE STATUS: Discussed with daughter, at length at the bedside. The patient used to be DNR/DNI and she had yellow form with her, but currently, daughter wishes her to be FULL CODE.   Total time spent on admission, patient care and history and physical: 50 minutes.    ____________________________ Albertine Patricia, MD dse:NTS D: 03/20/2013 00:39:21 ET T: 03/20/2013 01:12:03 ET JOB#: [id number]  cc: Albertine Patricia, MD, <Dictator> DAWOOD Graciela Husbands MD ELECTRONICALLY SIGNED 03/20/2013 6:43

## 2014-10-14 IMAGING — CR DG CHEST 2V
1 series · 2 of 2 positions shown · non-contrast
Comparison: 03/21/2013

CLINICAL DATA: Productive cough.  COPD.

EXAM:
CHEST  2 VIEW

[Series 1: x chest ap · 0.14mm/px · 2 of 2 slices shown]
[im 1/2]
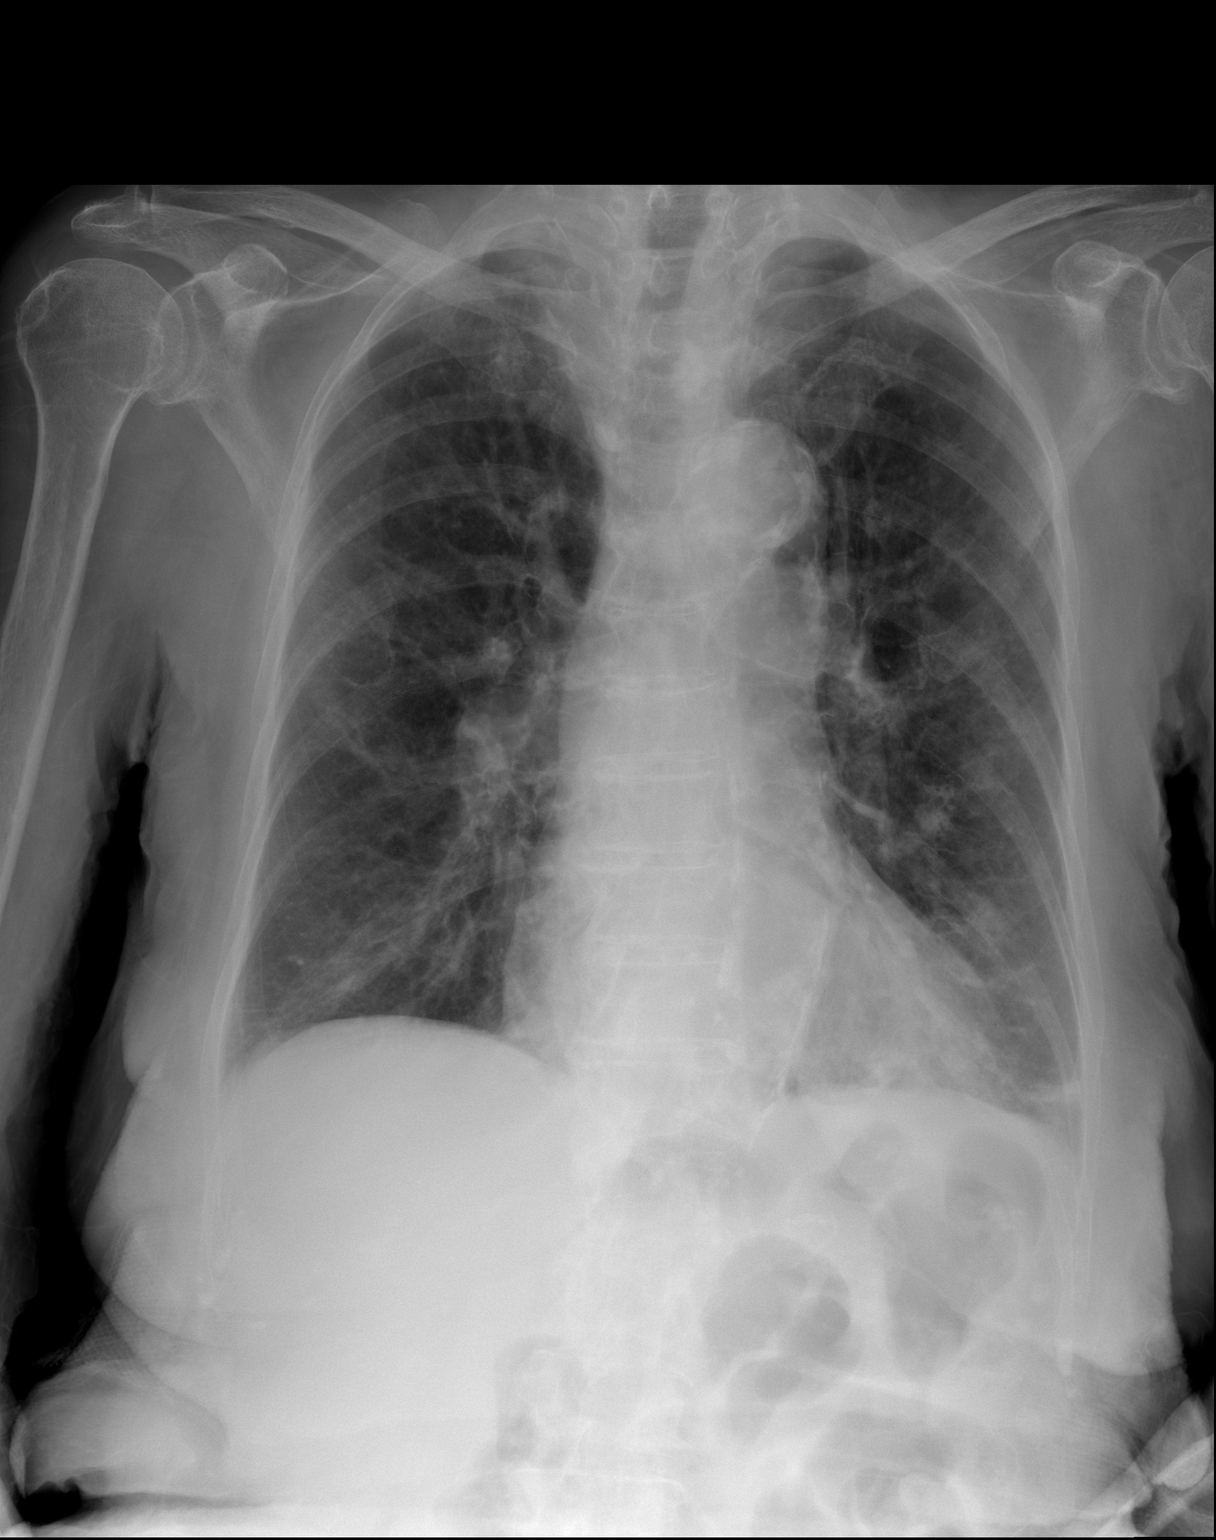
[im 2/2]
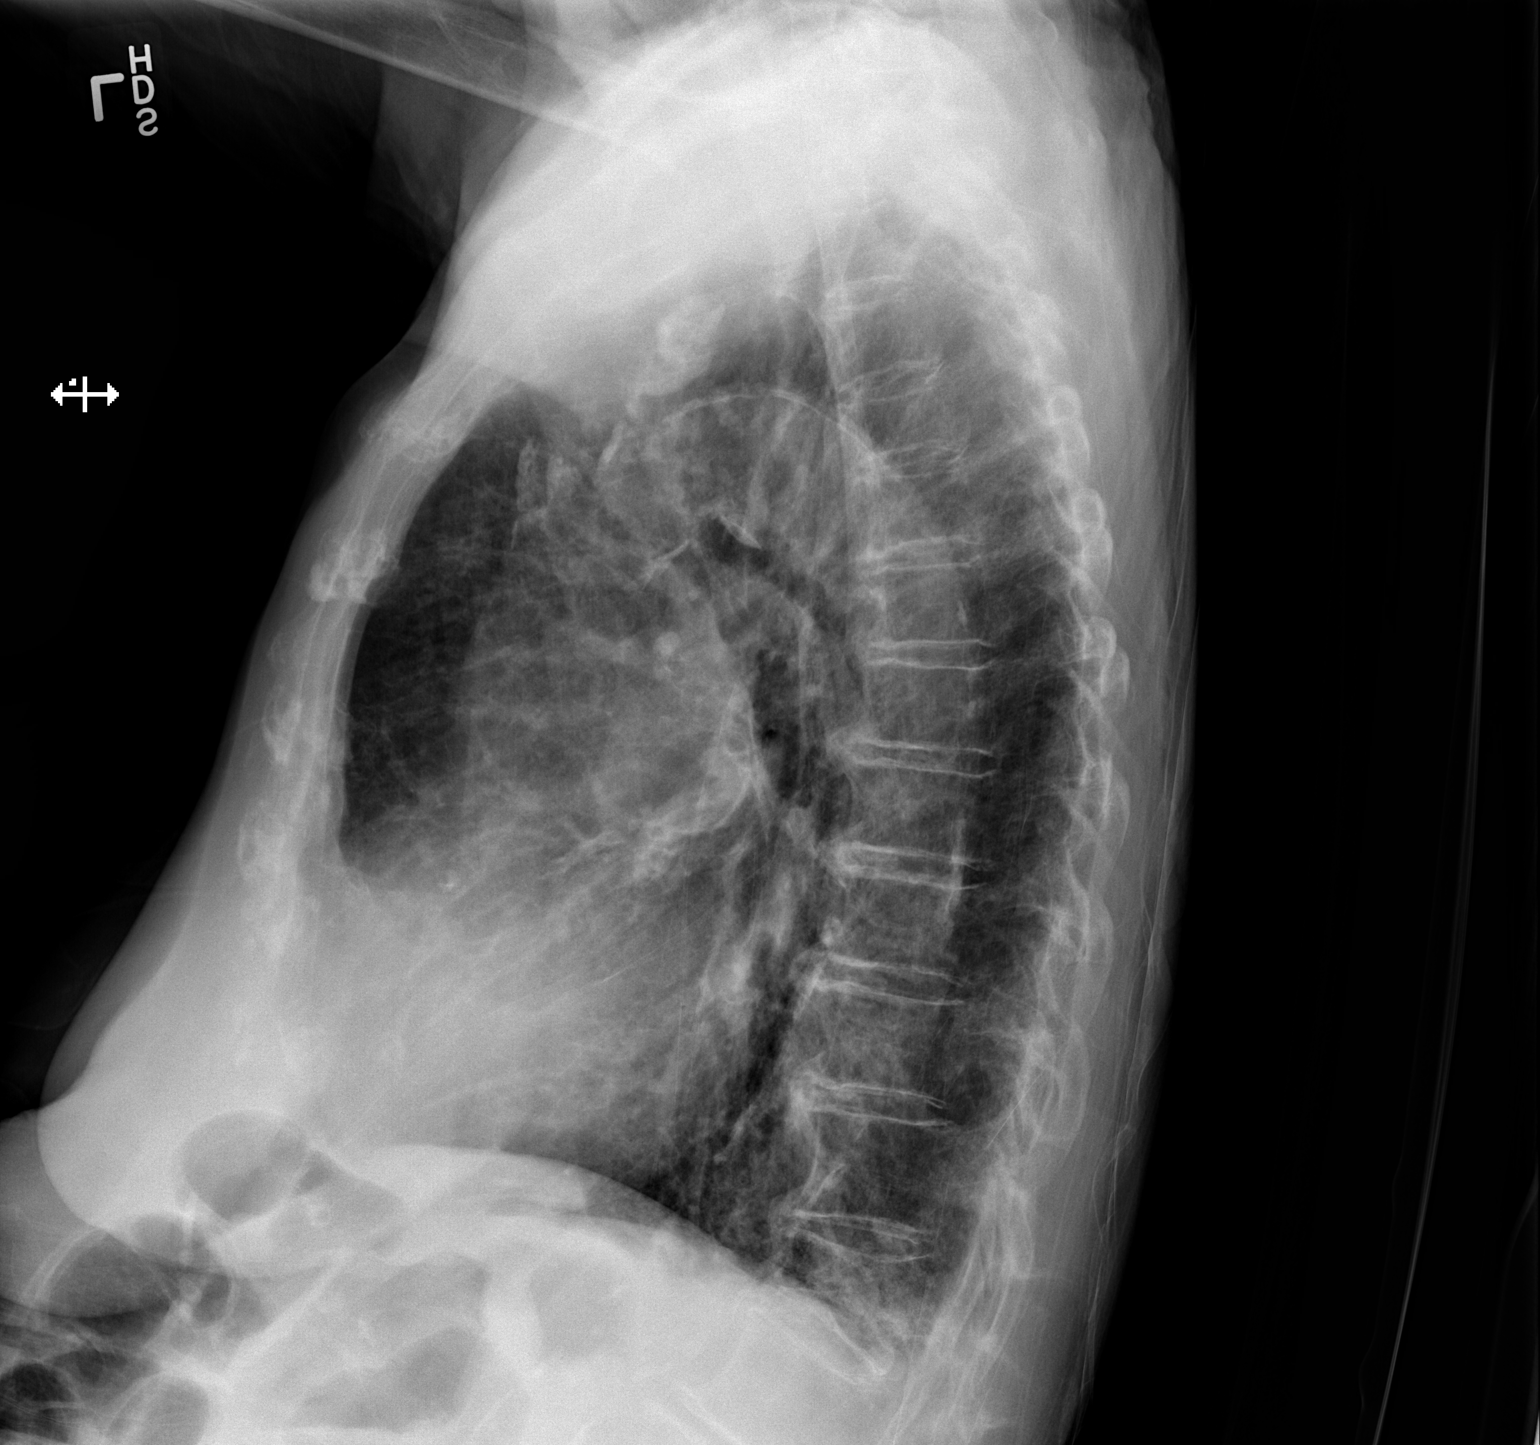

[2 of 2 positions shown; findings below may reference images not displayed]

FINDINGS: Mild cardiomegaly and ectasia of the thoracic aorta remains stable.
No evidence of congestive heart failure. Pulmonary hyperinflation
again seen, consistent with COPD. Mild opacity in the posterior left
lower lobe is new since previous study, suspicious for early or mild
pneumonia. No other areas of pulmonary infiltrate seen. No evidence
of pleural effusion. Lower thoracic vertebral body wedge compression
deformity noted on the lateral view.
IMPRESSION: New mild posterior left lower lobe opacity, suspicious for early or
mild pneumonia. Recommend chest radiographic followup in several
weeks to confirm resolution.

Stable mild cardiomegaly and COPD.

## 2015-07-28 ENCOUNTER — Encounter: Payer: Medicare Other | Attending: Surgery | Admitting: Surgery

## 2015-07-28 DIAGNOSIS — L89523 Pressure ulcer of left ankle, stage 3: Secondary | ICD-10-CM | POA: Diagnosis not present

## 2015-07-28 DIAGNOSIS — M1990 Unspecified osteoarthritis, unspecified site: Secondary | ICD-10-CM | POA: Insufficient documentation

## 2015-07-28 DIAGNOSIS — Z87891 Personal history of nicotine dependence: Secondary | ICD-10-CM | POA: Diagnosis not present

## 2015-07-28 DIAGNOSIS — H3530 Unspecified macular degeneration: Secondary | ICD-10-CM | POA: Diagnosis not present

## 2015-07-28 DIAGNOSIS — E871 Hypo-osmolality and hyponatremia: Secondary | ICD-10-CM | POA: Insufficient documentation

## 2015-07-28 DIAGNOSIS — J45909 Unspecified asthma, uncomplicated: Secondary | ICD-10-CM | POA: Insufficient documentation

## 2015-07-28 DIAGNOSIS — Z8673 Personal history of transient ischemic attack (TIA), and cerebral infarction without residual deficits: Secondary | ICD-10-CM | POA: Insufficient documentation

## 2015-07-28 DIAGNOSIS — I70245 Atherosclerosis of native arteries of left leg with ulceration of other part of foot: Secondary | ICD-10-CM | POA: Insufficient documentation

## 2015-07-28 DIAGNOSIS — G301 Alzheimer's disease with late onset: Secondary | ICD-10-CM | POA: Insufficient documentation

## 2015-07-28 DIAGNOSIS — R627 Adult failure to thrive: Secondary | ICD-10-CM | POA: Insufficient documentation

## 2015-07-29 NOTE — Progress Notes (Signed)
Madeline Cruz, Madeline Cruz ([id number]) Visit Report for 07/28/2015 Allergy List Details Patient Name: Madeline Cruz, Madeline Cruz Date of Service: 07/28/2015 12:45 PM Medical Record Patient Account Number: [id number] Number: Afful, RN, BSN, Treating RN: Date of Birth/Sex: [birth year] (82 y.o. Female) [facility] [place], [place] Other Clinician: Physician: BETH Treating Madeline KannerBritto, Madeline Referring Physician: Physician/Extender: Madeline AdeWeeks in Treatment: 0 Allergies Active Allergies sulfa Antibiotics Allergy Notes Electronic Signature(s) Signed: 07/28/2015 4:14:00 PM By: Madeline EricAfful, Madeline Cruz BSN, RN Entered By: Madeline EricAfful, Madeline Cruz on 07/28/2015 13:14:49 Madeline Cruz, Madeline Cruz ([id number]) -------------------------------------------------------------------------------- Arrival Information Details Patient Name: Madeline Cruz, Madeline Cruz Date of Service: 07/28/2015 12:45 PM Medical Record Patient Account Number: [id number] Number: Afful, RN, BSN, Treating RN: Date of Birth/Sex: [birth year] (82 y.o. Female) [facility] [place] Other Clinician: Physician: BETH Treating Cruz, Madeline Referring Physician: Physician/Extender: Madeline AdeWeeks in Treatment: 0 Visit Information Patient Arrived: Wheel Chair Arrival Time: 13:13 Accompanied By: dtr Transfer Assistance: None Patient Identification Verified: Yes Secondary Verification Process Yes Completed: Patient Requires Transmission-Based No Precautions: Patient Has Alerts: No Electronic Signature(s) Signed: 07/28/2015 4:14:00 PM By: Madeline EricAfful, Madeline Cruz BSN, RN Entered By: Madeline EricAfful, Madeline Cruz on 07/28/2015 13:14:00 Madeline Cruz, Madeline Cruz ([id number]) -------------------------------------------------------------------------------- Clinic Level of Care Assessment Details Patient Name: Madeline Cruz, Madeline Cruz Date of Service: 07/28/2015 12:45 PM Medical Record Patient Account Number: [id number] Number: Afful, RN, BSN, Treating RN: Date of Birth/Sex: [birth year] (82 y.o. Female) [facility]  [place] Other Clinician: Physician: BETH Treating Cruz, Madeline Referring Physician: Physician/Extender: Madeline AdeWeeks in Treatment: 0 Clinic Level of Care Assessment Items TOOL 1 Quantity Score []  - Use when EandM and Procedure is performed on INITIAL visit 0 ASSESSMENTS - Nursing Assessment / Reassessment X - General Physical Exam (combine w/ comprehensive assessment (listed just 1 20 below) when performed on new pt. evals) X - Comprehensive Assessment (HX, ROS, Risk Assessments, Wounds Hx, etc.) 1 25 ASSESSMENTS - Wound and Skin Assessment / Reassessment []  - Dermatologic / Skin Assessment (not related to wound area) 0 ASSESSMENTS - Ostomy and/or Continence Assessment and Care []  - Incontinence Assessment and Management 0 []  - Ostomy Care Assessment and Management (repouching, etc.) 0 PROCESS - Coordination of Care X - Simple Patient / Family Education for ongoing care 1 15 []  - Complex (extensive) Patient / Family Education for ongoing care 0 X - Staff obtains ChiropractorConsents, Records, Test Results / Process Orders 1 10 X - Staff telephones HHA, Nursing Homes / Clarify orders / etc 1 10 []  - Routine Transfer to another Facility (non-emergent condition) 0 []  - Routine Hospital Admission (non-emergent condition) 0 X - New Admissions / Manufacturing engineernsurance Authorizations / Ordering NPWT, Apligraf, etc. 1 15 []  - Emergency Hospital Admission (emergent condition) 0 PROCESS - Special Needs []  - Pediatric / Minor Patient Management 0 Madeline Cruz, Madeline Cruz ([id number]) []  - Isolation Patient Management 0 []  - Hearing / Language / Visual special needs 0 []  - Assessment of Community assistance (transportation, D/C planning, etc.) 0 []  - Additional assistance / Altered mentation 0 []  - Support Surface(s) Assessment (bed, cushion, seat, etc.) 0 INTERVENTIONS - Miscellaneous []  - External ear exam 0 []  - Patient Transfer (multiple staff / Nurse, adultHoyer Lift / Similar devices) 0 []  - Simple Staple / Suture removal (25  or less) 0 []  - Complex Staple / Suture removal (26 or more) 0 []  - Hypo/Hyperglycemic Management (do not check if billed separately) 0 X - Ankle / Brachial Index (ABI) - do not check if billed separately 1 15 Has the patient been seen at the hospital within the last three years: Yes  Total Score: 110 Level Of Care: New/Established - Level 3 Electronic Signature(s) Signed: 07/28/2015 4:14:00 PM By: Madeline Cruz BSN, RN Entered By: Madeline Cruz on 07/28/2015 14:09:11 Madeline Cruz ([id number]) -------------------------------------------------------------------------------- Encounter Discharge Information Details Patient Name: Madeline Cruz Date of Service: 07/28/2015 12:45 PM Medical Record Patient Account Number: [id number] Number: Afful, RN, BSN, Treating RN: Date of Birth/Sex: 11-21-1919 (82 y.o. Female) [facility], [place] Other Clinician: Physician: BETH Treating Madeline Kanner Referring Physician: Physician/Extender: Madeline Cruz in Treatment: 0 Encounter Discharge Information Items Discharge Pain Level: 0 Discharge Condition: Stable Ambulatory Status: Wheelchair Discharge Destination: Nursing Home Transportation: Other Accompanied By: dtr Schedule Follow-up Appointment: No Medication Reconciliation completed No and provided to Patient/Care Madeline Cruz: Provided on Clinical Summary of Care: 07/28/2015 Form Type Recipient Paper Patient LT Electronic Signature(s) Signed: 07/28/2015 2:11:54 PM By: Madeline Cruz Entered By: Madeline Cruz on 07/28/2015 14:11:54 Madeline Cruz ([id number]) -------------------------------------------------------------------------------- Lower Extremity Assessment Details Patient Name: Madeline Cruz Date of Service: 07/28/2015 12:45 PM Medical Record Patient Account Number: [id number] Number: Afful, RN, BSN, Treating RN: Date of Birth/Sex: [birth year] (82 y.o. Female) [facility], [place] Other  Clinician: Physician: BETH Treating Cruz, Madeline Referring Physician: Physician/Extender: Madeline Cruz in Treatment: 0 Vascular Assessment Pulses: Posterior Tibial Dorsalis Pedis Palpable: [Left:Yes] [Right:Yes] Extremity colors, hair growth, and conditions: Extremity Color: [Left:Normal] [Right:Normal] Hair Growth on Extremity: [Left:No] [Right:No] Temperature of Extremity: [Left:Warm] [Right:Warm] Capillary Refill: [Left:< 3 seconds] [Right:< 3 seconds] Blood Pressure: Brachial: [Left:111] [Right:111] Dorsalis Pedis: [Left:Dorsalis Pedis: 60] Ankle: Posterior Tibial: [Left:Posterior Tibial:] [Right:0.54] Toe Nail Assessment Left: Right: Thick: Yes Yes Discolored: Yes Yes Deformed: No No Improper Length and Hygiene: No No Electronic Signature(s) Signed: 07/28/2015 4:14:00 PM By: Madeline Cruz BSN, RN Entered By: Madeline Cruz on 07/28/2015 13:39:07 Madeline Cruz ([id number]) -------------------------------------------------------------------------------- Multi Wound Chart Details Patient Name: Madeline Cruz Date of Service: 07/28/2015 12:45 PM Medical Record Patient Account Number: [id number] Number: Afful, RN, BSN, Treating RN: Date of Birth/Sex: 1919-07-29 (82 y.o. Female) [facility], [place] Other Clinician: Physician: BETH Treating Cruz, Madeline Referring Physician: Physician/Extender: Madeline Cruz in Treatment: 0 Vital Signs Height(in): 59 Pulse(bpm): 67 Weight(lbs): Blood Pressure 111/66 (mmHg): Body Mass Index(BMI): Temperature(F): 98.0 Respiratory Rate 16 (breaths/min): Photos: [1:No Photos] [2:No Photos] [N/A:N/A] Wound Location: [1:Left Foot - Lateral] [2:Left Malleolus - Lateral] [N/A:N/A] Wounding Event: [1:Pressure Injury] [2:Pressure Injury] [N/A:N/A] Primary Etiology: [1:Pressure Ulcer] [2:Pressure Ulcer] [N/A:N/A] Comorbid History: [1:Cataracts, Asthma, Arrhythmia, History of pressure wounds, Osteoarthritis, Dementia]  [2:Cataracts, Asthma, Arrhythmia, History of pressure wounds, Osteoarthritis, Dementia] [N/A:N/A] Date Acquired: [1:05/10/2015] [2:05/10/2015] [N/A:N/A] Weeks of Treatment: [1:0] [2:0] [N/A:N/A] Wound Status: [1:Open] [2:Open] [N/A:N/A] Measurements L x W x D 3x2x0.2 [2:2.5x2x0.2] [N/A:N/A] (cm) Area (cm) : [1:4.712] [2:3.927] [N/A:N/A] Volume (cm) : [1:0.942] [2:0.785] [N/A:N/A] % Reduction in Area: [1:0.00%] [2:0.00%] [N/A:N/A] % Reduction in Volume: 0.00% [2:0.00%] [N/A:N/A] Classification: [1:Unstageable/Unclassified] [2:Unstageable/Unclassified] [N/A:N/A] Exudate Amount: [1:Large] [2:Medium] [N/A:N/A] Exudate Type: [1:Serosanguineous] [2:Serosanguineous] [N/A:N/A] Exudate Color: [1:red, brown] [2:red, brown] [N/A:N/A] Wound Margin: [1:Distinct, outline attached] [2:Distinct, outline attached] [N/A:N/A] Granulation Amount: [1:None Present (0%)] [2:None Present (0%)] [N/A:N/A] Necrotic Amount: [1:Large (67-100%)] [2:Large (67-100%)] [N/A:N/A] Exposed Structures: [1:Fascia: No Fat: No Tendon: No Muscle: No] [2:Fascia: No Fat: No Tendon: No Muscle: No] [N/A:N/A] Joint: No Joint: No Bone: No Bone: No Limited to Skin Limited to Skin Breakdown Breakdown Epithelialization: None None N/A Periwound Skin Texture: Edema: Yes Edema: Yes N/A Excoriation: No Excoriation: No Induration: No Induration: No Callus: No Callus: No Crepitus: No Crepitus: No Fluctuance: No Fluctuance: No Friable: No Friable: No  Rash: No Rash: No Scarring: No Scarring: No Periwound Skin Maceration: Yes Maceration: Yes N/A Moisture: Moist: Yes Moist: Yes Dry/Scaly: No Dry/Scaly: No Periwound Skin Color: Atrophie Blanche: No Atrophie Blanche: No N/A Cyanosis: No Cyanosis: No Ecchymosis: No Ecchymosis: No Erythema: No Erythema: No Hemosiderin Staining: No Hemosiderin Staining: No Mottled: No Mottled: No Pallor: No Pallor: No Rubor: No Rubor: No Temperature: No Abnormality No Abnormality  N/A Tenderness on Yes Yes N/A Palpation: Wound Preparation: Ulcer Cleansing: Ulcer Cleansing: N/A Rinsed/Irrigated with Rinsed/Irrigated with Saline Saline Topical Anesthetic Topical Anesthetic Applied: Other: lidocaine Applied: Other: lidocaine 4% 4% Treatment Notes Electronic Signature(s) Signed: 07/28/2015 4:14:00 PM By: Madeline Cruz BSN, RN Entered By: Madeline Cruz on 07/28/2015 13:47:49 Madeline Cruz ([id number]) -------------------------------------------------------------------------------- Multi-Disciplinary Care Plan Details Patient Name: Madeline Cruz Date of Service: 07/28/2015 12:45 PM Medical Record Patient Account Number: [id number] Number: Afful, RN, BSN, Treating RN: Date of Birth/Sex: 05-Nov-1919 (82 y.o. Female) [facility], [place] Other Clinician: Physician: BETH Treating Cruz, Madeline Referring Physician: Physician/Extender: Madeline Cruz in Treatment: 0 Active Inactive Orientation to the Wound Care Program Nursing Diagnoses: Knowledge deficit related to the wound healing center program Goals: Patient/caregiver will verbalize understanding of the Wound Healing Center Program Date Initiated: 07/28/2015 Goal Status: Active Interventions: Provide education on orientation to the wound center Notes: Pressure Nursing Diagnoses: Knowledge deficit related to causes and risk factors for pressure ulcer development Knowledge deficit related to management of pressures ulcers Potential for impaired tissue integrity related to pressure, friction, moisture, and shear Goals: Patient will remain free from development of additional pressure ulcers Date Initiated: 07/28/2015 Goal Status: Active Patient will remain free of pressure ulcers Date Initiated: 07/28/2015 Goal Status: Active Patient/caregiver will verbalize risk factors for pressure ulcer development Date Initiated: 07/28/2015 Goal Status: Active Patient/caregiver will verbalize understanding of  pressure ulcer management Date Initiated: 07/28/2015 Goal Status: Active CORRY, STORIE ([id number]) Interventions: Assess: immobility, friction, shearing, incontinence upon admission and as needed Assess offloading mechanisms upon admission and as needed Assess potential for pressure ulcer upon admission and as needed Provide education on pressure ulcers Treatment Activities: Patient referred for home evaluation of offloading devices/mattresses : 07/28/2015 Patient referred for pressure reduction/relief devices : 07/28/2015 Patient referred for seating evaluation to ensure proper offloading : 07/28/2015 Pressure reduction/relief device ordered : 07/28/2015 Notes: Wound/Skin Impairment Nursing Diagnoses: Impaired tissue integrity Knowledge deficit related to smoking impact on wound healing Knowledge deficit related to ulceration/compromised skin integrity Goals: Patient/caregiver will verbalize understanding of skin care regimen Date Initiated: 07/28/2015 Goal Status: Active Ulcer/skin breakdown will have a volume reduction of 30% by week 4 Date Initiated: 07/28/2015 Goal Status: Active Ulcer/skin breakdown will have a volume reduction of 50% by week 8 Date Initiated: 07/28/2015 Goal Status: Active Ulcer/skin breakdown will have a volume reduction of 80% by week 12 Date Initiated: 07/28/2015 Goal Status: Active Ulcer/skin breakdown will heal within 14 weeks Date Initiated: 07/28/2015 Goal Status: Active Interventions: Assess patient/caregiver ability to perform ulcer/skin care regimen upon admission and as needed Assess ulceration(s) every visit Provide education on ulcer and skin care Treatment Activities: ALLEA, KASSNER ([id number]) Skin care regimen initiated : 07/28/2015 Topical wound management initiated : 07/28/2015 Notes: Electronic Signature(s) Signed: 07/28/2015 4:14:00 PM By: Madeline Cruz BSN, RN Entered By: Madeline Cruz on 07/28/2015 13:46:19 Madeline Cruz  ([id number]) -------------------------------------------------------------------------------- Pain Assessment Details Patient Name: Madeline Cruz Date of Service: 07/28/2015 12:45 PM Medical Record Patient Account Number: [id number] Number: Afful, RN, BSN, Treating RN: Date of Birth/Sex: Jun 11, 1919 (82 y.o. Female) [facility], [place] Other Clinician: Physician: BETH Treating Madeline Kanner Referring Physician: Physician/Extender: Madeline Cruz in Treatment: 0 Active Problems Location of Pain Severity and Description of Pain Patient Has Paino No Site Locations Pain Management and Medication Current Pain Management: Electronic Signature(s) Signed: 07/28/2015 4:14:00 PM By: Madeline Cruz BSN, RN Entered By: Madeline Cruz on 07/28/2015 13:14:06 Madeline Cruz ([id number]) -------------------------------------------------------------------------------- Patient/Caregiver Education Details Patient Name: Madeline Cruz Date of Service: 07/28/2015 12:45 PM Medical Record Patient Account Number: [id number] Number: Afful, RN, BSN, Treating RN: Date of Birth/Gender: [birth year] (82 y.o. Female) [facility], [place] Other Clinician: Physician: BETH Treating Madeline Kanner Referring Physician: Physician/Extender: Madeline Cruz in Treatment: 0 Education Assessment Education Provided To: Patient Education Topics Provided Pressure: Methods: Explain/Verbal Responses: State content correctly Welcome To The Wound Care Center: Methods: Explain/Verbal Responses: State content correctly Wound/Skin Impairment: Methods: Explain/Verbal Responses: State content correctly Electronic Signature(s) Signed: 07/28/2015 4:14:00 PM By: Madeline Cruz BSN, RN Entered By: Madeline Cruz on 07/28/2015 14:10:51 Madeline Cruz ([id number]) -------------------------------------------------------------------------------- Wound Assessment Details Patient Name: Madeline Cruz Date of  Service: 07/28/2015 12:45 PM Medical Record Patient Account Number: [id number] Number: Afful, RN, BSN, Treating RN: Date of Birth/Sex: 1920/05/01 (82 y.o. Female) [facility], [place] Other Clinician: Physician: BETH Treating Cruz, Madeline Referring Physician: Physician/Extender: Madeline Cruz in Treatment: 0 Wound Status Wound Number: 1 Primary Pressure Ulcer Etiology: Wound Location: Left Foot - Lateral Wound Open Wounding Event: Pressure Injury Status: Date Acquired: 05/10/2015 Comorbid Cataracts, Asthma, Arrhythmia, History Weeks Of Treatment: 0 History: of pressure wounds, Osteoarthritis, Clustered Wound: No Dementia Photos Photo Uploaded By: Madeline Cruz on 07/28/2015 16:13:00 Wound Measurements Length: (cm) 3 Width: (cm) 2 Depth: (cm) 0.2 Area: (cm) 4.712 Volume: (cm) 0.942 % Reduction in Area: 0% % Reduction in Volume: 0% Epithelialization: None Tunneling: No Undermining: No Wound Description Classification: Unstageable/Unclassified Wound Margin: Distinct, outline attached Madeline Cruz ([id number]) Foul Odor After Cleansing: No Exudate Amount: Large Exudate Type: Serosanguineous Exudate Color: red, brown Wound Bed Granulation Amount: None Present (0%) Exposed Structure Necrotic Amount: Large (67-100%) Fascia Exposed: No Necrotic Quality: Adherent Slough Fat Layer Exposed: No Tendon Exposed: No Muscle Exposed: No Joint Exposed: No Bone Exposed: No Limited to Skin Breakdown Periwound Skin Texture Texture Color No Abnormalities Noted: No No Abnormalities Noted: No Callus: No Atrophie Blanche: No Crepitus: No Cyanosis: No Excoriation: No Ecchymosis: No Fluctuance: No Erythema: No Friable: No Hemosiderin Staining: No Induration: No Mottled: No Localized Edema: Yes Pallor: No Rash: No Rubor: No Scarring: No Temperature / Pain Moisture Temperature: No Abnormality No Abnormalities Noted: No Tenderness on Palpation:  Yes Dry / Scaly: No Maceration: Yes Moist: Yes Wound Preparation Ulcer Cleansing: Rinsed/Irrigated with Saline Topical Anesthetic Applied: Other: lidocaine 4%, Treatment Notes Wound #1 (Left, Lateral Foot) 1. Cleansed with: Clean wound with Normal Saline 3. Peri-wound Care: Skin Prep 4. Dressing Applied: Santyl Ointment 5. Secondary Dressing Applied Bordered Foam Dressing DESHUNDA, THACKSTON ([id number]) Dry Gauze 6. Footwear/Offloading device applied Other footwear/offloading device applied (specify in notes) Notes sage bootS Electronic Signature(s) Signed: 07/28/2015 4:14:00 PM By: Madeline Cruz BSN, RN Entered By: Madeline Cruz on 07/28/2015 13:31:46 Madeline Cruz ([id number]) -------------------------------------------------------------------------------- Wound Assessment Details Patient Name: Madeline Cruz Date of Service: 07/28/2015 12:45 PM Medical Record Patient Account Number: [id number] Number: Afful, RN, BSN, Treating RN: Date of Birth/Sex: [birth year] (82 y.o. Female) [facility], [place] Other Clinician: Physician: BETH Treating Madeline Kanner Referring Physician: Physician/Extender: Madeline Cruz in Treatment: 0 Wound Status Wound Number: 2 Primary Pressure Ulcer Etiology: Wound  Location: Left Malleolus - Lateral Wound Open Wounding Event: Pressure Injury Status: Date Acquired: 05/10/2015 Comorbid Cataracts, Asthma, Arrhythmia, History Weeks Of Treatment: 0 History: of pressure wounds, Osteoarthritis, Clustered Wound: No Dementia Photos Photo Uploaded By: Madeline Cruz on 07/28/2015 16:13:00 Wound Measurements Length: (cm) 2.5 Width: (cm) 2 Depth: (cm) 0.2 Area: (cm) 3.927 Volume: (cm) 0.785 % Reduction in Area: 0% % Reduction in Volume: 0% Epithelialization: None Tunneling: No Undermining: No Wound Description Classification: Unstageable/Unclassified Wound Margin: Distinct, outline attached Madeline Cruz ([id number]) Foul  Odor After Cleansing: No Exudate Amount: Medium Exudate Type: Serosanguineous Exudate Color: red, brown Wound Bed Granulation Amount: None Present (0%) Exposed Structure Necrotic Amount: Large (67-100%) Fascia Exposed: No Necrotic Quality: Adherent Slough Fat Layer Exposed: No Tendon Exposed: No Muscle Exposed: No Joint Exposed: No Bone Exposed: No Limited to Skin Breakdown Periwound Skin Texture Texture Color No Abnormalities Noted: No No Abnormalities Noted: No Callus: No Atrophie Blanche: No Crepitus: No Cyanosis: No Excoriation: No Ecchymosis: No Fluctuance: No Erythema: No Friable: No Hemosiderin Staining: No Induration: No Mottled: No Localized Edema: Yes Pallor: No Rash: No Rubor: No Scarring: No Temperature / Pain Moisture Temperature: No Abnormality No Abnormalities Noted: No Tenderness on Palpation: Yes Dry / Scaly: No Maceration: Yes Moist: Yes Wound Preparation Ulcer Cleansing: Rinsed/Irrigated with Saline Topical Anesthetic Applied: Other: lidocaine 4%, Treatment Notes Wound #2 (Left, Lateral Malleolus) 1. Cleansed with: Clean wound with Normal Saline 3. Peri-wound Care: Skin Prep 4. Dressing Applied: Santyl Ointment 5. Secondary Dressing Applied Bordered Foam Dressing ZYAIRE, DUMAS ([id number]) Dry Gauze 6. Footwear/Offloading device applied Other footwear/offloading device applied (specify in notes) Notes sage bootS Electronic Signature(s) Signed: 07/28/2015 4:14:00 PM By: Madeline Cruz BSN, RN Entered By: Madeline Cruz on 07/28/2015 13:33:42 Madeline Cruz ([id number]) -------------------------------------------------------------------------------- Vitals Details Patient Name: Madeline Cruz Date of Service: 07/28/2015 12:45 PM Medical Record Patient Account Number: [id number] Number: Afful, RN, BSN, Treating RN: Date of Birth/Sex: [birth year] (82 y.o. Female) [facility], [place] Other Clinician: Physician:  BETH Treating Cruz, Madeline Referring Physician: Physician/Extender: Madeline Cruz in Treatment: 0 Vital Signs Time Taken: 13:21 Temperature (F): 98.0 Height (in): 59 Pulse (bpm): 67 Respiratory Rate (breaths/min): 16 Blood Pressure (mmHg): 111/66 Reference Range: 80 - 120 mg / dl Electronic Signature(s) Signed: 07/28/2015 4:14:00 PM By: Madeline Cruz BSN, RN Entered By: Madeline Cruz on 07/28/2015 13:26:01

## 2015-07-29 NOTE — Progress Notes (Signed)
NADA, GODLEY ([id number]) Visit Report for 07/28/2015 Chief Complaint Document Details Patient Name: Madeline Cruz, Madeline Cruz Date of Service: 07/28/2015 12:45 PM Medical Record Patient Account Number: [id number] Number: Afful, RN, BSN, Treating RN: Date of Birth/Sex: 1919-06-12 (82 y.o. Female) [facility], [place] Other Clinician: Physician: BETH Treating Evlyn Kanner Referring Physician: Physician/Extender: Tania Ade in Treatment: 0 Information Obtained from: Patient Chief Complaint Patient is at the clinic for treatment of an open pressure ulcer with a arterial competent and this has been there on the left foot since the last 3 months Electronic Signature(s) Signed: 07/28/2015 3:22:43 PM By: Evlyn Kanner MD, FACS Entered By: Evlyn Kanner on 07/28/2015 15:22:43 Madeline Cruz ([id number]) -------------------------------------------------------------------------------- Debridement Details Patient Name: Madeline Cruz Date of Service: 07/28/2015 12:45 PM Medical Record Patient Account Number: [id number] Number: Afful, RN, BSN, Treating RN: Date of Birth/Sex: Sep 26, 1919 (82 y.o. Female) [facility], [place] Other Clinician: Physician: BETH Treating Allecia Bells Referring Physician: Physician/Extender: Tania Ade in Treatment: 0 Debridement Performed for Wound #1 Left,Lateral Foot Assessment: Performed By: Physician Evlyn Kanner, MD Debridement: Debridement Pre-procedure Yes Verification/Time Out Taken: Start Time: 13:47 Pain Control: Lidocaine 4% Topical Solution Level: Skin/Subcutaneous Tissue Total Area Debrided (L x 3 (cm) x 2 (cm) = 6 (cm) W): Tissue and other Viable, Non-Viable, Exudate, Fibrin/Slough, Subcutaneous material debrided: Instrument: Forceps, Scissors Bleeding: Minimum Hemostasis Achieved: Pressure End Time: 13:49 Procedural Pain: 0 Post Procedural Pain: 0 Response to Treatment: Procedure was tolerated  well Post Debridement Measurements of Total Wound Length: (cm) 3 Stage: Category/Stage III Width: (cm) 2 Depth: (cm) 0.2 Volume: (cm) 0.942 Post Procedure Diagnosis Same as Pre-procedure Electronic Signature(s) Signed: 07/28/2015 3:22:02 PM By: Evlyn Kanner MD, FACS Signed: 07/28/2015 4:14:00 PM By: Elpidio Eric BSN, RN Entered By: Evlyn Kanner on 07/28/2015 15:22:02 Madeline Cruz ([id number]) -------------------------------------------------------------------------------- Debridement Details Patient Name: Madeline Cruz Date of Service: 07/28/2015 12:45 PM Medical Record Patient Account Number: [id number] Number: Afful, RN, BSN, Treating RN: Date of Birth/Sex: 06-03-1919 (82 y.o. Female) [facility], [place] Other Clinician: Physician: BETH Treating Linwood Gullikson Referring Physician: Physician/Extender: Tania Ade in Treatment: 0 Debridement Performed for Wound #2 Left,Lateral Malleolus Assessment: Performed By: Physician Evlyn Kanner, MD Debridement: Debridement Pre-procedure Yes Verification/Time Out Taken: Start Time: 13:49 Pain Control: Lidocaine 4% Topical Solution Level: Skin/Subcutaneous Tissue Total Area Debrided (L x 2.5 (cm) x 2 (cm) = 5 (cm) W): Tissue and other Viable, Non-Viable, Exudate, Fibrin/Slough, Subcutaneous material debrided: Instrument: Forceps, Scissors Bleeding: Minimum Hemostasis Achieved: Pressure End Time: 13:52 Procedural Pain: 0 Post Procedural Pain: 0 Response to Treatment: Procedure was tolerated well Post Debridement Measurements of Total Wound Length: (cm) 2.5 Stage: Category/Stage III Width: (cm) 2 Depth: (cm) 0.2 Volume: (cm) 0.785 Post Procedure Diagnosis Same as Pre-procedure Electronic Signature(s) Signed: 07/28/2015 3:22:12 PM By: Evlyn Kanner MD, FACS Signed: 07/28/2015 4:14:00 PM By: Elpidio Eric BSN, RN Entered By: Evlyn Kanner on 07/28/2015 15:22:12 Madeline Cruz  ([id number]) -------------------------------------------------------------------------------- HPI Details Patient Name: Madeline Cruz Date of Service: 07/28/2015 12:45 PM Medical Record Patient Account Number: [id number] Number: Afful, RN, BSN, Treating RN: Date of Birth/Sex: 04-30-1920 (82 y.o. Female) [facility], [place] Other Clinician: Physician: BETH Treating Evlyn Kanner Referring Physician: Physician/Extender: Tania Ade in Treatment: 0 History of Present Illness Location: left lateral foot Quality: Patient reports experiencing a sharp pain to affected area(s). Severity: Patient states wound are getting worse. Duration: Patient has had the wound for > 3 months prior to seeking treatment at the wound center Timing: Pain in wound  is Intermittent (comes and goes Context: The wound appeared gradually over time Modifying Factors: Other treatment(s) tried include:local care and offloading Associated Signs and Symptoms: Patient reports having difficulty standing for long periods. HPI Description: 82-year-old patient who has history of advanced dementia and has been living at a nursing home for a long while. She was a smoker in the past and quit smoking at the age of 57 and has a past medical history of advanced dementia, hyponatremia, urinary tract infection, failure to thrive, macular degeneration and glaucoma. She is also status post appendectomy. Electronic Signature(s) Signed: 07/28/2015 3:23:29 PM By: Evlyn Kanner MD, FACS Previous Signature: 07/28/2015 1:35:10 PM Version By: Evlyn Kanner MD, FACS Entered By: Evlyn Kanner on 07/28/2015 15:23:29 Madeline Cruz ([id number]) -------------------------------------------------------------------------------- Physical Exam Details Patient Name: Madeline Cruz Date of Service: 07/28/2015 12:45 PM Medical Record Patient Account Number: [id number] Number: Afful, RN, BSN, Treating RN: Date of Birth/Sex:  1919-11-11 (82 y.o. Female) [facility], [place] Other Clinician: Physician: BETH Treating Evlyn Kanner Referring Physician: Physician/Extender: Tania Ade in Treatment: 0 Constitutional . Pulse regular. Respirations normal and unlabored. Afebrile. . Eyes Nonicteric. Reactive to light. Ears, Nose, Mouth, and Throat Lips, teeth, and gums WNL.Marland Kitchen Moist mucosa without lesions. Neck supple and nontender. No palpable supraclavicular or cervical adenopathy. Normal sized without goiter. Respiratory WNL. No retractions.. Cardiovascular Pedal Pulses WNL. ABI on the left could not be measured on the right was 0.54. she has no palpable pulses and there is minimal lymphedema. Chest Breasts symmetical and no nipple discharge.. Breast tissue WNL, no masses, lumps, or tenderness.. Gastrointestinal (GI) Abdomen without masses or tenderness.. No liver or spleen enlargement or tenderness.. Lymphatic No adneopathy. No adenopathy. No adenopathy. Musculoskeletal Adexa without tenderness or enlargement.. Digits and nails w/o clubbing, cyanosis, infection, petechiae, ischemia, or inflammatory conditions.. Integumentary (Hair, Skin) No suspicious lesions. No crepitus or fluctuance. No peri-wound warmth or erythema. No masses.Marland Kitchen Psychiatric Judgement and insight Intact.. No evidence of depression, anxiety, or agitation.. Notes the ulcerated area on the lateral part of the left foot and ankle continue to have significant amount of slough and this was sharply dissected with a curette and bleeding controlled with pressure Electronic Signature(s) Signed: 07/28/2015 3:24:28 PM By: Evlyn Kanner MD, FACS Madeline Cruz ([id number]) Entered By: Evlyn Kanner on 07/28/2015 15:24:27 Madeline Cruz ([id number]) -------------------------------------------------------------------------------- Physician Orders Details Patient Name: Madeline Cruz Date of Service: 07/28/2015 12:45 PM Medical Record Patient  Account Number: [id number] Number: Afful, RN, BSN, Treating RN: Date of Birth/Sex: 1920-04-26 (82 y.o. Female) [facility], [place] Other Clinician: Physician: BETH Treating Evlyn Kanner Referring Physician: Physician/Extender: Tania Ade in Treatment: 0 Verbal / Phone Orders: Yes Clinician: Afful, RN, BSN, Rita Read Back and Verified: Yes Diagnosis Coding Wound Cleansing Wound #1 Left,Lateral Foot o Clean wound with Normal Saline. Wound #2 Left,Lateral Malleolus o Clean wound with Normal Saline. Anesthetic Wound #1 Left,Lateral Foot o Topical Lidocaine 4% cream applied to wound bed prior to debridement Wound #2 Left,Lateral Malleolus o Topical Lidocaine 4% cream applied to wound bed prior to debridement Skin Barriers/Peri-Wound Care Wound #1 Left,Lateral Foot o Skin Prep Wound #2 Left,Lateral Malleolus o Skin Prep Primary Wound Dressing Wound #1 Left,Lateral Foot o Santyl Ointment Wound #2 Left,Lateral Malleolus o Santyl Ointment Secondary Dressing Wound #1 Left,Lateral Foot o Boardered Foam Dressing Wound #2 Left,Lateral Malleolus o Boardered Foam Dressing Madeline Cruz ([id number]) Dressing Change Frequency Wound #1 Left,Lateral Foot o Change dressing every day. Wound #2 Left,Lateral Malleolus o Change dressing every day. Follow-up  Appointments Wound #1 Left,Lateral Foot o Return Appointment in 1 week. Wound #2 Left,Lateral Malleolus o Return Appointment in 1 week. Off-Loading Wound #1 Left,Lateral Foot o Heel suspension boot to: - SAGE BOOTS Wound #2 Left,Lateral Malleolus o Heel suspension boot to: - SAGE BOOTS Additional Orders / Instructions Wound #1 Left,Lateral Foot o Increase protein intake. Wound #2 Left,Lateral Malleolus o Increase protein intake. Home Health Wound #1 Left,Lateral Foot o Continue Home Health Visits - [facility] o Home Health Nurse may  visit PRN to address patientos wound care needs. o FACE TO FACE ENCOUNTER: MEDICARE and MEDICAID PATIENTS: I certify that this patient is under my care and that I had a face-to-face encounter that meets the physician face-to-face encounter requirements with this patient on this date. The encounter with the patient was in whole or in part for the following MEDICAL CONDITION: (primary reason for Home Healthcare) MEDICAL NECESSITY: I certify, that based on my findings, NURSING services are a medically necessary home health service. HOME BOUND STATUS: I certify that my clinical findings support that this patient is homebound (i.e., Due to illness or injury, pt requires aid of supportive devices such as crutches, cane, wheelchairs, walkers, the use of special transportation or the assistance of another person to leave their place of residence. There is a normal inability to leave the home and doing so requires considerable and taxing effort. Other absences are for medical reasons / religious services and are infrequent or of short duration when for other reasons). Madeline Cruz, Madeline Cruz ([id number]) o If current dressing causes regression in wound condition, may D/C ordered dressing product/s and apply Normal Saline Moist Dressing daily until next Wound Healing Center / Other MD appointment. Notify Wound Healing Center of regression in wound condition at [phone]. o Please direct any NON-WOUND related issues/requests for orders to patient's Primary Care Physician Wound #2 Left,Lateral Malleolus o Continue Home Health Visits - [facility] o Home Health Nurse may visit PRN to address patientos wound care needs. o FACE TO FACE ENCOUNTER: MEDICARE and MEDICAID PATIENTS: I certify that this patient is under my care and that I had a face-to-face encounter that meets the physician face-to-face encounter requirements with this patient on this date. The encounter with  the patient was in whole or in part for the following MEDICAL CONDITION: (primary reason for Home Healthcare) MEDICAL NECESSITY: I certify, that based on my findings, NURSING services are a medically necessary home health service. HOME BOUND STATUS: I certify that my clinical findings support that this patient is homebound (i.e., Due to illness or injury, pt requires aid of supportive devices such as crutches, cane, wheelchairs, walkers, the use of special transportation or the assistance of another person to leave their place of residence. There is a normal inability to leave the home and doing so requires considerable and taxing effort. Other absences are for medical reasons / religious services and are infrequent or of short duration when for other reasons). o If current dressing causes regression in wound condition, may D/C ordered dressing product/s and apply Normal Saline Moist Dressing daily until next Wound Healing Center / Other MD appointment. Notify Wound Healing Center of regression in wound condition at [phone]. o Please direct any NON-WOUND related issues/requests for orders to patient's Primary Care Physician Medications-please add to medication list. Wound #1 Left,Lateral Foot o Santyl Enzymatic Ointment o Other: - ZINC, ViTAMIN C, MVI Wound #2 Left,Lateral Malleolus o Santyl Enzymatic Ointment o Other: -  ZINC, ViTAMIN C, MVI Radiology o X-ray, foot - AP and lateral Electronic Signature(s) Signed: 07/28/2015 4:14:00 PM By: Elpidio Eric BSN, RN Signed: 07/28/2015 4:29:10 PM By: Evlyn Kanner MD, FACS Entered By: Elpidio Eric on 07/28/2015 13:55:07 Madeline Cruz ([id number]) -------------------------------------------------------------------------------- Prescription 07/28/2015 Patient Name: Madeline Cruz Physician: Evlyn Kanner MD Date of Birth: [birth year] NPI#: [id number] Sex: F DEA#: [id number] Phone #: [phone] License #: Patient Address:  [facility] ATTN PAM Kappa [address] [facility] [place], [place] [address] [address], [address] [place], [place] [address] [phone] Allergies sulfa Antibiotics Physician's Orders Santyl Enzymatic Ointment Signature(s): Date(s): Electronic Signature(s) Signed: 07/28/2015 4:14:00 PM By: Elpidio Eric BSN, RN Signed: 07/28/2015 4:29:10 PM By: Evlyn Kanner MD, FACS Entered By: Elpidio Eric on 07/28/2015 13:55:07 Madeline Cruz ([id number]) --------------------------------------------------------------------------------  Problem List Details Patient Name: Madeline Cruz Date of Service: 07/28/2015 12:45 PM Medical Record Patient Account Number: [id number] Number: Afful, RN, BSN, Treating RN: Date of Birth/Sex: 1920-02-13 (82 y.o. Female) [facility], [place] Other Clinician: Physician: BETH Treating Evlyn Kanner Referring Physician: Physician/Extender: Tania Ade in Treatment: 0 Active Problems ICD-10 Encounter Code Description Active Date Diagnosis L89.523 Pressure ulcer of left ankle, stage 3 07/28/2015 Yes G30.1 Alzheimer's disease with late onset 07/28/2015 Yes Z87.891 Personal history of nicotine dependence 07/28/2015 Yes I70.245 Atherosclerosis of native arteries of left leg with ulceration 07/28/2015 Yes of other part of foot Inactive Problems Resolved Problems Electronic Signature(s) Signed: 07/28/2015 3:21:48 PM By: Evlyn Kanner MD, FACS Entered By: Evlyn Kanner on 07/28/2015 15:21:47 Madeline Cruz ([id number]) -------------------------------------------------------------------------------- Progress Note Details Patient Name: Madeline Cruz Date of Service: 07/28/2015 12:45 PM Medical Record Patient Account Number: [id number] Number: Afful, RN, BSN, Treating RN: Date of Birth/Sex: 1920/05/11 (82 y.o. Female) [facility], [place] Other  Clinician: Physician: BETH Treating Thimothy Barretta Referring Physician: Physician/Extender: Tania Ade in Treatment: 0 Subjective Chief Complaint Information obtained from Patient Patient is at the clinic for treatment of an open pressure ulcer with a arterial competent and this has been there on the left foot since the last 3 months History of Present Illness (HPI) The following HPI elements were documented for the patient's wound: Location: left lateral foot Quality: Patient reports experiencing a sharp pain to affected area(s). Severity: Patient states wound are getting worse. Duration: Patient has had the wound for > 3 months prior to seeking treatment at the wound center Timing: Pain in wound is Intermittent (comes and goes Context: The wound appeared gradually over time Modifying Factors: Other treatment(s) tried include:local care and offloading Associated Signs and Symptoms: Patient reports having difficulty standing for long periods. 67-year-old patient who has history of advanced dementia and has been living at a nursing home for a long while. She was a smoker in the past and quit smoking at the age of 87 and has a past medical history of advanced dementia, hyponatremia, urinary tract infection, failure to thrive, macular degeneration and glaucoma. She is also status post appendectomy. Wound History Patient presents with 1 open wound that has been present for approximately 2 month. Laboratory tests have not been performed in the last month. Patient reportedly has not tested positive for an antibiotic resistant organism. Patient reportedly has not tested positive for osteomyelitis. Patient reportedly has not had testing performed to evaluate circulation in the legs. Patient History Allergies sulfa Antibiotics Family History Cancer - Siblings, Child, Diabetes - Siblings, Heart Disease - Siblings, Thyroid Problems - Child, No family history of  Hereditary Spherocytosis,  Hypertension, Kidney Disease, Lung Disease, Seizures, LLEWELLYN, SCHOENBERGER ([id number]) Stroke, Tuberculosis. Social History Former smoker - quit 60 years, Marital Status - Widowed, Alcohol Use - Never, Drug Use - No History, Caffeine Use - Rarely. Medical History Eyes Patient has history of Cataracts Ear/Nose/Mouth/Throat Denies history of Chronic sinus problems/congestion, Middle ear problems Hematologic/Lymphatic Denies history of Anemia, Hemophilia, Human Immunodeficiency Virus, Lymphedema, Sickle Cell Disease Respiratory Patient has history of Asthma Denies history of Aspiration, Chronic Obstructive Pulmonary Disease (COPD), Pneumothorax, Sleep Apnea, Tuberculosis Cardiovascular Patient has history of Arrhythmia Denies history of Angina, Congestive Heart Failure, Coronary Artery Disease, Deep Vein Thrombosis, Hypertension, Hypotension, Myocardial Infarction, Peripheral Arterial Disease, Peripheral Venous Disease, Phlebitis, Vasculitis Gastrointestinal Denies history of Cirrhosis , Colitis, Crohn s, Hepatitis A, Hepatitis B, Hepatitis C Endocrine Denies history of Type I Diabetes, Type II Diabetes Genitourinary Denies history of End Stage Renal Disease Immunological Denies history of Lupus Erythematosus, Raynaud s, Scleroderma Integumentary (Skin) Patient has history of History of pressure wounds Musculoskeletal Patient has history of Osteoarthritis Neurologic Patient has history of Dementia Oncologic Denies history of Received Chemotherapy, Received Radiation Psychiatric Denies history of Anorexia/bulimia, Confinement Anxiety Medical And Surgical History Notes Cardiovascular TIA Review of Systems (ROS) Constitutional Symptoms (General Health) The patient has no complaints or symptoms. Eyes Complains or has symptoms of Glasses / Contacts. Ear/Nose/Mouth/Throat Madeline Cruz ([id number]) The patient has no complaints or symptoms. Hematologic/Lymphatic The patient has  no complaints or symptoms. Respiratory The patient has no complaints or symptoms. Cardiovascular The patient has no complaints or symptoms. Gastrointestinal The patient has no complaints or symptoms. Endocrine The patient has no complaints or symptoms. Genitourinary The patient has no complaints or symptoms. Immunological The patient has no complaints or symptoms. Integumentary (Skin) Complains or has symptoms of Wounds. Musculoskeletal Complains or has symptoms of Muscle Pain, Muscle Weakness. Neurologic Complains or has symptoms of Focal/Weakness. Oncologic The patient has no complaints or symptoms. Psychiatric The patient has no complaints or symptoms. Medications medihoney topical paste topical two times weekly aspirin 81 mg chewable tablet oral 1 1 tablet,chewable oral daily Mapap (acetaminophen) 500 mg capsule oral 1 1 capsule oral two times daily as needed Oysco 500/D 500 mg (1,250 mg)-200 unit tablet oral 1 1 tablet oral two times daily Ensure oral liquid oral 1 1 liquid oral three times daily Q-Tussin 100 mg/5 mL oral liquid oral liquid oral 10 mL every six hours as needed cranberry 450 mg tablet oral 1 1 tablet oral two times daily Miralax 17 gram oral powder packet oral 1 1 powder in packet oral every other day Fleet Enema 19 gram-7 gram/118 mL rectal 1 1 enema rectal every day as needed CertaVite Senior-Antioxidant 0.4 mg-300 mcg-250 mcg tablet oral 1 1 tablet oral daily Objective Constitutional Pulse regular. Respirations normal and unlabored. Afebrile. Madeline Cruz, Madeline Cruz ([id number]) Vitals Time Taken: 1:21 PM, Height: 59 in, Temperature: 98.0 F, Pulse: 67 bpm, Respiratory Rate: 16 breaths/min, Blood Pressure: 111/66 mmHg. Eyes Nonicteric. Reactive to light. Ears, Nose, Mouth, and Throat Lips, teeth, and gums WNL.Marland Kitchen Moist mucosa without lesions. Neck supple and nontender. No palpable supraclavicular or cervical adenopathy. Normal sized without  goiter. Respiratory WNL. No retractions.. Cardiovascular Pedal Pulses WNL. ABI on the left could not be measured on the right was 0.54. she has no palpable pulses and there is minimal lymphedema. Chest Breasts symmetical and no nipple discharge.. Breast tissue WNL, no masses, lumps, or tenderness.. Gastrointestinal (GI) Abdomen without masses or tenderness.. No liver or spleen enlargement or tenderness.Marland Kitchen  Lymphatic No adneopathy. No adenopathy. No adenopathy. Musculoskeletal Adexa without tenderness or enlargement.. Digits and nails w/o clubbing, cyanosis, infection, petechiae, ischemia, or inflammatory conditions.Marland Kitchen Psychiatric Judgement and insight Intact.. No evidence of depression, anxiety, or agitation.. General Notes: the ulcerated area on the lateral part of the left foot and ankle continue to have significant amount of slough and this was sharply dissected with a curette and bleeding controlled with pressure Integumentary (Hair, Skin) No suspicious lesions. No crepitus or fluctuance. No peri-wound warmth or erythema. No masses.. Wound #1 status is Open. Original cause of wound was Pressure Injury. The wound is located on the Left,Lateral Foot. The wound measures 3cm length x 2cm width x 0.2cm depth; 4.712cm^2 area and 0.942cm^3 volume. The wound is limited to skin breakdown. There is no tunneling or undermining noted. There is a large amount of serosanguineous drainage noted. The wound margin is distinct with the outline attached to the wound base. There is no granulation within the wound bed. There is a large (67-100%) amount of necrotic tissue within the wound bed including Adherent Slough. The periwound skin appearance exhibited: Localized Edema, Maceration, Moist. The periwound skin appearance did not exhibit: Callus, Madeline Cruz, Madeline Cruz ([id number]) Crepitus, Excoriation, Fluctuance, Friable, Induration, Rash, Scarring, Dry/Scaly, Atrophie Blanche, Cyanosis, Ecchymosis,  Hemosiderin Staining, Mottled, Pallor, Rubor, Erythema. Periwound temperature was noted as No Abnormality. The periwound has tenderness on palpation. Wound #2 status is Open. Original cause of wound was Pressure Injury. The wound is located on the Left,Lateral Malleolus. The wound measures 2.5cm length x 2cm width x 0.2cm depth; 3.927cm^2 area and 0.785cm^3 volume. The wound is limited to skin breakdown. There is no tunneling or undermining noted. There is a medium amount of serosanguineous drainage noted. The wound margin is distinct with the outline attached to the wound base. There is no granulation within the wound bed. There is a large (67- 100%) amount of necrotic tissue within the wound bed including Adherent Slough. The periwound skin appearance exhibited: Localized Edema, Maceration, Moist. The periwound skin appearance did not exhibit: Callus, Crepitus, Excoriation, Fluctuance, Friable, Induration, Rash, Scarring, Dry/Scaly, Atrophie Blanche, Cyanosis, Ecchymosis, Hemosiderin Staining, Mottled, Pallor, Rubor, Erythema. Periwound temperature was noted as No Abnormality. The periwound has tenderness on palpation. 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 Daughter's request that no aggressive treatment or investigations been done and she definitely is not under any surgical intervention. I have recommended: 1. Santyl ointment locally to be applied to the heel and ankle on a daily basis. 2. offloading techniques have been discussed in great detail 3. Adequate protein intake and vitamin supplements including vitamin  C and zinc 4. x-ray of the left foot The daughter's questions of all been answered and they will be making a regular visits to the wound center. Procedures Wound #1 Madeline Cruz, Madeline Cruz ([id number]) Wound #1 is a Pressure Ulcer located on the Left,Lateral Foot . There was a Skin/Subcutaneous Tissue Debridement (78295-62130) debridement with total area of 6 sq cm performed by Evlyn Kanner, MD. with the following instrument(s): Forceps and Scissors to remove Viable and Non-Viable tissue/material including Exudate, Fibrin/Slough, and Subcutaneous after achieving pain control using Lidocaine 4% Topical  Solution. A time out was conducted prior to the start of the procedure. A Minimum amount of bleeding was controlled with Pressure. The procedure was tolerated well with a pain level of 0 throughout and a pain level of 0 following the procedure. Post Debridement Measurements: 3cm length x 2cm width x 0.2cm depth; 0.942cm^3 volume. Post debridement Stage noted as Category/Stage III. Post procedure Diagnosis Wound #1: Same as Pre-Procedure Wound #2 Wound #2 is a Pressure Ulcer located on the Left,Lateral Malleolus . There was a Skin/Subcutaneous Tissue Debridement ([id number]) debridement with total area of 5 sq cm performed by Evlyn Kanner, MD. with the following instrument(s): Forceps and Scissors to remove Viable and Non-Viable tissue/material including Exudate, Fibrin/Slough, and Subcutaneous after achieving pain control using Lidocaine 4% Topical Solution. A time out was conducted prior to the start of the procedure. A Minimum amount of bleeding was controlled with Pressure. The procedure was tolerated well with a pain level of 0 throughout and a pain level of 0 following the procedure. Post Debridement Measurements: 2.5cm length x 2cm width x 0.2cm depth; 0.785cm^3 volume. Post debridement Stage noted as Category/Stage III. Post procedure Diagnosis Wound #2: Same as Pre-Procedure Plan Wound  Cleansing: Wound #1 Left,Lateral Foot: Clean wound with Normal Saline. Wound #2 Left,Lateral Malleolus: Clean wound with Normal Saline. Anesthetic: Wound #1 Left,Lateral Foot: Topical Lidocaine 4% cream applied to wound bed prior to debridement Wound #2 Left,Lateral Malleolus: Topical Lidocaine 4% cream applied to wound bed prior to debridement Skin Barriers/Peri-Wound Care: Wound #1 Left,Lateral Foot: Skin Prep Wound #2 Left,Lateral Malleolus: Skin Prep Primary Wound Dressing: Wound #1 Left,Lateral Foot: Santyl Ointment Wound #2 Left,Lateral Malleolus: Santyl Ointment Secondary Dressing: Madeline Cruz, Madeline Cruz ([id number]) Wound #1 Left,Lateral Foot: Boardered Foam Dressing Wound #2 Left,Lateral Malleolus: Boardered Foam Dressing Dressing Change Frequency: Wound #1 Left,Lateral Foot: Change dressing every day. Wound #2 Left,Lateral Malleolus: Change dressing every day. Follow-up Appointments: Wound #1 Left,Lateral Foot: Return Appointment in 1 week. Wound #2 Left,Lateral Malleolus: Return Appointment in 1 week. Off-Loading: Wound #1 Left,Lateral Foot: Heel suspension boot to: - SAGE BOOTS Wound #2 Left,Lateral Malleolus: Heel suspension boot to: - SAGE BOOTS Additional Orders / Instructions: Wound #1 Left,Lateral Foot: Increase protein intake. Wound #2 Left,Lateral Malleolus: Increase protein intake. Home Health: Wound #1 Left,Lateral Foot: Continue Home Health Visits - [facility] Home Health Nurse may visit PRN to address patient s wound care needs. FACE TO FACE ENCOUNTER: MEDICARE and MEDICAID PATIENTS: I certify that this patient is under my care and that I had a face-to-face encounter that meets the physician face-to-face encounter requirements with this patient on this date. The encounter with the patient was in whole or in part for the following MEDICAL CONDITION: (primary reason for Home Healthcare) MEDICAL NECESSITY: I  certify, that based on my findings, NURSING services are a medically necessary home health service. HOME BOUND STATUS: I certify that my clinical findings support that this patient is homebound (i.e., Due to illness or injury, pt requires aid of supportive devices such as crutches, cane, wheelchairs, walkers, the use of special transportation or the assistance of another person to leave their place of residence. There is a normal inability to leave the home and doing so requires considerable and taxing effort. Other absences are for medical reasons / religious services and are infrequent or of short duration when for other reasons). If current dressing causes regression in wound condition, may D/C ordered dressing product/s and apply Normal Saline Moist Dressing daily until next  Wound Healing Center / Other MD appointment. Notify Wound Healing Center of regression in wound condition at [phone]. Please direct any NON-WOUND related issues/requests for orders to patient's Primary Care Physician Wound #2 Left,Lateral Malleolus: Continue Home Health Visits - [facility] Home Health Nurse may visit PRN to address patient s wound care needs. FACE TO FACE ENCOUNTER: MEDICARE and MEDICAID PATIENTS: I certify that this patient is under my care and that I had a face-to-face encounter that meets the physician face-to-face encounter requirements with this patient on this date. The encounter with the patient was in whole or in part for the following MEDICAL CONDITION: (primary reason for Home Healthcare) MEDICAL NECESSITY: I certify, that based on my findings, NURSING services are a medically necessary home health service. HOME BOUND STATUS: I certify that my clinical findings support that this patient is homebound (i.e., Due to Madeline Cruz, Madeline Cruz ([id number]) illness or injury, pt requires aid of supportive devices such as crutches, cane, wheelchairs, walkers, the use of  special transportation or the assistance of another person to leave their place of residence. There is a normal inability to leave the home and doing so requires considerable and taxing effort. Other absences are for medical reasons / religious services and are infrequent or of short duration when for other reasons). If current dressing causes regression in wound condition, may D/C ordered dressing product/s and apply Normal Saline Moist Dressing daily until next Wound Healing Center / Other MD appointment. Notify Wound Healing Center of regression in wound condition at [phone]. Please direct any NON-WOUND related issues/requests for orders to patient's Primary Care Physician Medications-please add to medication list.: Wound #1 Left,Lateral Foot: Santyl Enzymatic Ointment Other: - ZINC, ViTAMIN C, MVI Wound #2 Left,Lateral Malleolus: Santyl Enzymatic Ointment Other: - ZINC, ViTAMIN C, MVI Radiology ordered were: X-ray, foot - AP and lateral this elderly lady who is 83 years old has significant advanced dementia and comes along with her daughter who is a caregiver and power of attorney. Daughter's request that no aggressive treatment or investigations been done and she definitely is not under any surgical intervention. I have recommended: 1. Santyl ointment locally to be applied to the heel and ankle on a daily basis. 2. offloading techniques have been discussed in great detail 3. Adequate protein intake and vitamin supplements including vitamin C and zinc 4. x-ray of the left foot The daughter's questions of all been answered and they will be making a regular visits to the wound center. Electronic Signature(s) Signed: 07/28/2015 3:27:17 PM By: Evlyn Kanner MD, FACS Entered By: Evlyn Kanner on 07/28/2015 15:27:17 Madeline Cruz ([id number]) -------------------------------------------------------------------------------- ROS/PFSH Details Patient Name: Madeline Cruz Date of  Service: 07/28/2015 12:45 PM Medical Record Patient Account Number: [id number] Number: Afful, RN, BSN, Treating RN: Date of Birth/Sex: 1919/09/02 (82 y.o. Female) [facility], [place] Other Clinician: Physician: BETH Treating Daziah Hesler Referring Physician: Physician/Extender: Tania Ade in Treatment: 0 Wound History Do you currently have one or more open woundso Yes How many open wounds do you currently haveo 1 Approximately how long have you had your woundso 2 month Has your wound(s) ever healed and then re-openedo No Have you had any lab work done in the past montho No Have you tested positive for an antibiotic resistant organism (MRSA, VRE)o No Have you tested positive for osteomyelitis (bone infection)o No Have you had any tests for circulation on your legso No Eyes Complaints and Symptoms: Positive for: Glasses / Contacts Medical History: Positive for:  Cataracts Integumentary (Skin) Complaints and Symptoms: Positive for: Wounds Medical History: Positive for: History of pressure wounds Musculoskeletal Complaints and Symptoms: Positive for: Muscle Pain; Muscle Weakness Medical History: Positive for: Osteoarthritis Neurologic Complaints and Symptoms: Positive for: Focal/Weakness Medical HistoryARYONNA, GUNNERSON ([id number]) Positive for: Dementia Constitutional Symptoms (General Health) Complaints and Symptoms: No Complaints or Symptoms Ear/Nose/Mouth/Throat Complaints and Symptoms: No Complaints or Symptoms Medical History: Negative for: Chronic sinus problems/congestion; Middle ear problems Hematologic/Lymphatic Complaints and Symptoms: No Complaints or Symptoms Medical History: Negative for: Anemia; Hemophilia; Human Immunodeficiency Virus; Lymphedema; Sickle Cell Disease Respiratory Complaints and Symptoms: No Complaints or Symptoms Medical History: Positive for: Asthma Negative for: Aspiration; Chronic Obstructive Pulmonary Disease  (COPD); Pneumothorax; Sleep Apnea; Tuberculosis Cardiovascular Complaints and Symptoms: No Complaints or Symptoms Medical History: Positive for: Arrhythmia Negative for: Angina; Congestive Heart Failure; Coronary Artery Disease; Deep Vein Thrombosis; Hypertension; Hypotension; Myocardial Infarction; Peripheral Arterial Disease; Peripheral Venous Disease; Phlebitis; Vasculitis Past Medical History Notes: TIA Gastrointestinal Complaints and Symptoms: No Complaints or Symptoms Medical HistoryMarland Kitchen Madeline Cruz, Madeline Cruz ([id number]) Negative for: Cirrhosis ; Colitis; Crohnos; Hepatitis A; Hepatitis B; Hepatitis C Endocrine Complaints and Symptoms: No Complaints or Symptoms Medical History: Negative for: Type I Diabetes; Type II Diabetes Genitourinary Complaints and Symptoms: No Complaints or Symptoms Medical History: Negative for: End Stage Renal Disease Immunological Complaints and Symptoms: No Complaints or Symptoms Medical History: Negative for: Lupus Erythematosus; Raynaudos; Scleroderma Oncologic Complaints and Symptoms: No Complaints or Symptoms Medical History: Negative for: Received Chemotherapy; Received Radiation Psychiatric Complaints and Symptoms: No Complaints or Symptoms Medical History: Negative for: Anorexia/bulimia; Confinement Anxiety HBO Extended History Items Eyes: Cataracts Family and Social History Cancer: Yes - Siblings, Child; Diabetes: Yes - Siblings; Heart Disease: Yes - Siblings; Hereditary Spherocytosis: No; Hypertension: No; Kidney Disease: No; Lung Disease: No; Seizures: No; Stroke: No; Thyroid Problems: Yes - Child; Tuberculosis: No; Former smoker - quit 60 years; Marital Status - Widowed; Alcohol Use: Never; Drug Use: No History; Caffeine Use: Rarely; Financial Concerns: No; Food, Clothing Madeline Cruz, Madeline Cruz ([id number]) or Shelter Needs: No; Support System Lacking: No; Transportation Concerns: No; Advanced Directives: Yes Electronic  Signature(s) Signed: 07/28/2015 4:14:00 PM By: Elpidio Eric BSN, RN Signed: 07/28/2015 4:29:10 PM By: Evlyn Kanner MD, FACS Entered By: Elpidio Eric on 07/28/2015 13:18:49 Madeline Cruz ([id number]) -------------------------------------------------------------------------------- SuperBill Details Patient Name: Madeline Cruz Date of Service: 07/28/2015 Medical Record Patient Account Number: [id number] Number: Afful, RN, BSN, Treating RN: Date of Birth/Sex: [birth year] (82 y.o. Female) [facility], [place] Other Clinician: Physician: BETH Treating Cayne Yom Referring Physician: Physician/Extender: Tania Ade in Treatment: 0 Diagnosis Coding ICD-10 Codes Code Description [phone] Pressure ulcer of left ankle, stage 3 G30.1 Alzheimer's disease with late onset Z87.891 Personal history of nicotine dependence I70.245 Atherosclerosis of native arteries of left leg with ulceration of other part of foot Facility Procedures CPT4: Description Modifier Quantity Code [id number] - WOUND CARE VISIT-LEV 3 EST PT 1 CPT4: [id number] - DEB SUBQ TISSUE 20 SQ CM/< 1 ICD-10 Description Diagnosis L89.523 Pressure ulcer of left ankle, stage 3 G30.1 Alzheimer's disease with late onset Z87.891 Personal history of nicotine dependence I70.245 Atherosclerosis of native  arteries of left leg with ulceration of other part of foot Physician Procedures CPT4: Description Modifier Quantity Code [id number] - WC PHYS LEVEL 4 - NEW PT 25 1 ICD-10 Description Diagnosis L89.523 Pressure ulcer of left ankle, stage 3 G30.1 Alzheimer's disease with late onset Z87.891 Personal history of nicotine dependence  I70.245 Atherosclerosis of native arteries of left leg with  ulceration of other part of foot Madeline RoupUCKER, Madeline Cruz ([id number]) Electronic Signature(s) Signed: 07/28/2015 3:28:36 PM By: Evlyn KannerBritto, Tharon Kitch MD, FACS Previous Signature: 07/28/2015 3:27:51 PM Version By: Evlyn KannerBritto, Patric Vanpelt MD, FACS Entered By: Evlyn KannerBritto,  Leilyn Frayre on 07/28/2015 15:28:36

## 2015-07-29 NOTE — Progress Notes (Signed)
SHALEE, PAOLO ([id number]) Visit Report for 07/28/2015 Abuse/Suicide Risk Screen Details Patient Name: Madeline Cruz, Madeline Cruz Date of Service: 07/28/2015 12:45 PM Medical Record Patient Account Number: [id number] Number: Afful, RN, BSN, Treating RN: Date of Birth/Sex: 03-Aug-1919 (82 y.o. Female) [facility], [place] Other Clinician: Physician: BETH Treating Britto, Errol Referring Physician: Physician/Extender: Tania Ade in Treatment: 0 Abuse/Suicide Risk Screen Items Answer ABUSE/SUICIDE RISK SCREEN: Has anyone close to you tried to hurt or harm you recentlyo No Do you feel uncomfortable with anyone in your familyo No Has anyone forced you do things that you didnot want to doo No Do you have any thoughts of harming yourselfo No Patient displays signs or symptoms of abuse and/or neglect. No Electronic Signature(s) Signed: 07/28/2015 4:14:00 PM By: Elpidio Eric BSN, RN Entered By: Elpidio Eric on 07/28/2015 13:18:59 Madeline Cruz ([id number]) -------------------------------------------------------------------------------- Activities of Daily Living Details Patient Name: Madeline Cruz Date of Service: 07/28/2015 12:45 PM Medical Record Patient Account Number: [id number] Number: Afful, RN, BSN, Treating RN: Date of Birth/Sex: March 27, 1920 (82 y.o. Female) [facility], [place] Other Clinician: Physician: BETH Treating Evlyn Kanner Referring Physician: Physician/Extender: Tania Ade in Treatment: 0 Activities of Daily Living Items Answer Activities of Daily Living (Please select one for each item) Drive Automobile Not Able Take Medications Need Assistance Use Telephone Not Able Care for Appearance Need Assistance Use Toilet Need Assistance Bath / Shower Not Able Dress Self Not Able Feed Self Not Able Walk Not Able Get In / Out Bed Not Able Housework Not Able Prepare Meals Not Able Handle Money Not Able Shop for Self Not Able Electronic  Signature(s) Signed: 07/28/2015 4:14:00 PM By: Elpidio Eric BSN, RN Entered By: Elpidio Eric on 07/28/2015 13:19:41 Madeline Cruz ([id number]) -------------------------------------------------------------------------------- Education Assessment Details Patient Name: Madeline Cruz Date of Service: 07/28/2015 12:45 PM Medical Record Patient Account Number: [id number] Number: Afful, RN, BSN, Treating RN: Date of Birth/Sex: 1920-03-16 (82 y.o. Female) [facility], [place] Other Clinician: Physician: BETH Treating Evlyn Kanner Referring Physician: Physician/Extender: Tania Ade in Treatment: 0 Primary Learner Assessed: Caregiver Pam Baggert Reason Patient is not Primary Learner: Alzeihmers Learning Preferences/Education Level/Primary Language Learning Preference: Explanation Highest Education Level: College or Above Preferred Language: English Cognitive Barrier Assessment/Beliefs Language Barrier: No Physical Barrier Assessment Impaired Vision: Yes Glasses Impaired Hearing: No Decreased Hand dexterity: No Knowledge/Comprehension Assessment Knowledge Level: Medium Comprehension Level: Medium Ability to understand written Medium instructions: Ability to understand verbal Medium instructions: Motivation Assessment Anxiety Level: Calm Cooperation: Cooperative Education Importance: Acknowledges Need Interest in Health Problems: Asks Questions Perception: Coherent Willingness to Engage in Self- Medium Management Activities: Readiness to Engage in Self- Medium Management Activities: Electronic Signature(s) Signed: 07/28/2015 4:14:00 PM By: Elpidio Eric BSN, RN [place], Gardiner Ramus ([id number]) Entered By: Elpidio Eric on 07/28/2015 13:20:50 Madeline Cruz ([id number]) -------------------------------------------------------------------------------- Fall Risk Assessment Details Patient Name: Madeline Cruz Date of Service: 07/28/2015 12:45 PM Medical Record Patient  Account Number: [id number] Number: Afful, RN, BSN, Treating RN: Date of Birth/Sex: [birth year] (82 y.o. Female) [facility], [place] Other Clinician: Physician: BETH Treating Britto, Errol Referring Physician: Physician/Extender: Tania Ade in Treatment: 0 Fall Risk Assessment Items Have you had 2 or more falls in the last 12 monthso 0 No Have you had any fall that resulted in injury in the last 12 monthso 0 No FALL RISK ASSESSMENT: History of falling - immediate or within 3 months 0 No Secondary diagnosis 0 No Ambulatory aid None/bed rest/wheelchair/nurse 0 Yes Crutches/cane/walker 0 No Furniture 0 No IV Access/Saline Lock  0 No Gait/Training Normal/bed rest/immobile 0 Yes Weak 10 Yes Impaired 20 Yes Mental Status Oriented to own ability 0 Yes Electronic Signature(s) Signed: 07/28/2015 4:14:00 PM By: Elpidio EricAfful, Rita BSN, RN Entered By: Elpidio EricAfful, Rita on 07/28/2015 13:21:08 Madeline RoupUCKER, [place] ([id number]) -------------------------------------------------------------------------------- Foot Assessment Details Patient Name: Madeline RoupUCKER, [name] Date of Service: 07/28/2015 12:45 PM Medical Record Patient Account Number: [id number] Number: Afful, RN, BSN, Treating RN: Date of Birth/Sex: 1919-12-12 (82 y.o. Female[facility] [place] Other Clinician: Physician: BETH Treating Britto, Errol Referring Physician: Physician/Extender: Tania AdeWeeks in Treatment: 0 Foot Assessment Items Site Locations + = Sensation present, - = Sensation absent, C = Callus, U = Ulcer R = Redness, W = Warmth, M = Maceration, PU = Pre-ulcerative lesion F = Fissure, S = Swelling, D = Dryness Assessment Right: Left: Other Deformity: No No Prior Foot Ulcer: No No Prior Amputation: No No Charcot Joint: No No Ambulatory Status: Non-ambulatory Assistance Device: Wheelchair Gait: Surveyor, miningUnsteady Electronic Signature(s) Signed: 07/28/2015 4:14:00 PM By: Elpidio EricAfful, Rita BSN, RN Entered By:  Elpidio EricAfful, Rita on 07/28/2015 13:21:30 Madeline RoupUCKER, Nianna ([name]) Weinberg, Arcelia ([id number]) -------------------------------------------------------------------------------- Nutrition Risk Assessment Details Patient Name: Madeline RoupUCKER, Krystie Date of Service: 07/28/2015 12:45 PM Medical Record Patient Account Number: [id number] Number: Afful, RN, BSN, Treating RN: Date of Birth/Sex: 1919-12-12 (82 y.o. Female[facility] [place] Other Clinician: Physician: BETH Treating Evlyn KannerBritto, Errol Referring Physician: Physician/Extender: Tania AdeWeeks in Treatment: 0 Height (in): Weight (lbs): Body Mass Index (BMI): Nutrition Risk Assessment Items NUTRITION RISK SCREEN: I have an illness or condition that made me change the kind and/or 0 No amount of food I eat I eat fewer than two meals per day 0 No I eat few fruits and vegetables, or milk products 0 No I have three or more drinks of beer, liquor or wine almost every day 0 No I have tooth or mouth problems that make it hard for me to eat 0 No I don't always have enough money to buy the food I need 0 No I eat alone most of the time 0 No I take three or more different prescribed or over-the-counter drugs a 0 No day Without wanting to, I have lost or gained 10 pounds in the last six 0 No months I am not always physically able to shop, cook and/or feed myself 0 No Nutrition Protocols Good Risk Protocol 0 No interventions needed Moderate Risk Protocol Electronic Signature(s) Signed: 07/28/2015 4:14:00 PM By: Elpidio EricAfful, Rita BSN, RN Entered By: Elpidio EricAfful, Rita on 07/28/2015 13:21:20

## 2015-08-04 ENCOUNTER — Encounter: Payer: Medicare Other | Admitting: Surgery

## 2015-08-04 DIAGNOSIS — I70245 Atherosclerosis of native arteries of left leg with ulceration of other part of foot: Secondary | ICD-10-CM | POA: Diagnosis not present

## 2015-08-05 NOTE — Progress Notes (Signed)
WILHEMENIA, CAMBA ([id number]) Visit Report for 08/04/2015 Chief Complaint Document Details Patient Name: Madeline Cruz, Madeline Cruz 08/04/2015 12:45 Date of Service: PM Medical Record [id number] Number: Patient Account Number: [id number] Date of Birth/Sex: 11/15/1919 (82 y.o. Female) Afful, RN, BSN, Treating RN: [place] [facility] [place], [place] Physician: BETH Other Clinician: Odelia Gage Treating Referring Physician: Lehman Prom Physician/Extender: Tania Ade in Treatment: 1 Information Obtained from: Patient Chief Complaint Patient is at the clinic for treatment of an open pressure ulcer with a arterial competent and this has been there on the left foot since the last 3 months Electronic Signature(s) Signed: 08/04/2015 2:01:27 PM By: Evlyn Kanner MD, FACS Entered By: Evlyn Kanner on 08/04/2015 14:01:27 Madeline Cruz ([id number]) -------------------------------------------------------------------------------- Debridement Details Patient Name: Madeline Cruz, Madeline Cruz 08/04/2015 12:45 Date of Service: PM Medical Record [id number] Number: Patient Account Number: [id number] Date of Birth/Sex: 1919/06/26 (82 y.o. Female) Afful, RN, BSN, Treating RN: [place] [facility] [place], [place] Physician: BETH Other Clinician: Odelia Gage Treating Referring Physician: Lehman Prom Physician/Extender: Tania Ade in Treatment: 1 Debridement Performed for Wound #1 Left,Lateral Foot Assessment: Performed By: Physician Evlyn Kanner, MD Debridement: Debridement Pre-procedure Yes Verification/Time Out Taken: Start Time: 13:15 Pain Control: Lidocaine 4% Topical Solution Level: Skin/Subcutaneous Tissue Total Area Debrided (L x 3.2 (cm) x 2.3 (cm) = 7.36 (cm) W): Tissue and other Viable, Non-Viable, Exudate, Fibrin/Slough, Subcutaneous material debrided: Instrument: Curette Bleeding: Minimum Hemostasis Achieved: Pressure End Time: 13:15 Procedural Pain: 0 Post Procedural Pain:  0 Response to Treatment: Procedure was tolerated well Post Debridement Measurements of Total Wound Length: (cm) 3.2 Stage: Category/Stage III Width: (cm) 2.3 Depth: (cm) 0.2 Volume: (cm) 1.156 Post Procedure Diagnosis Same as Pre-procedure Electronic Signature(s) Signed: 08/04/2015 2:01:12 PM By: Evlyn Kanner MD, FACS Signed: 08/04/2015 3:26:54 PM By: Elpidio Eric BSN, RN Previous Signature: 08/04/2015 1:39:55 PM Version By: Elpidio Eric BSN, RN Entered By: Evlyn Kanner on 08/04/2015 14:01:11 Madeline Cruz ([name] ([id number]) -------------------------------------------------------------------------------- Debridement Details Patient Name: Madeline Cruz, Madeline Cruz 08/04/2015 12:45 Date of Service: PM Medical Record [id number] Number: Patient Account Number: [id number] Date of Birth/Sex: December 02, 1919 (82 y.o. Female) Afful, RN, BSN, Treating RN: [place] [facility] [place], [place] Physician: BETH Other Clinician: Odelia Gage Treating Referring Physician: Lehman Prom Physician/Extender: Tania Ade in Treatment: 1 Debridement Performed for Wound #2 Left,Lateral Malleolus Assessment: Performed By: Physician Evlyn Kanner, MD Debridement: Debridement Pre-procedure Yes Verification/Time Out Taken: Start Time: 13:15 Pain Control: Lidocaine 4% Topical Solution Level: Skin/Subcutaneous Tissue Total Area Debrided (L x 2.5 (cm) x 2 (cm) = 5 (cm) W): Tissue and other Non-Viable, Exudate, Fibrin/Slough, Subcutaneous material debrided: Instrument: Curette Bleeding: Minimum Hemostasis Achieved: Pressure End Time: 13:16 Procedural Pain: 0 Post Procedural Pain: 0 Response to Treatment: Procedure was tolerated well Post Debridement Measurements of Total Wound Length: (cm) 2.5 Stage: Unstageable/Unclassified Width: (cm) 2 Depth: (cm) 0.2 Volume: (cm) 0.785 Post Procedure Diagnosis Same as Pre-procedure Electronic Signature(s) Signed: 08/04/2015 2:01:21  PM By: Evlyn Kanner MD, FACS Signed: 08/04/2015 3:26:54 PM By: Elpidio Eric BSN, RN Previous Signature: 08/04/2015 1:39:55 PM Version By: Elpidio Eric BSN, RN Entered By: Evlyn Kanner on 08/04/2015 14:01:21 Madeline Cruz ([name] ([id number]) -------------------------------------------------------------------------------- HPI Details Patient Name: Madeline Cruz, Madeline Cruz 08/04/2015 12:45 Date of Service: PM Medical Record [id number] Number: Patient Account Number: [id number] Date of Birth/Sex: 1919-12-06 (82 y.o. Female) Afful, RN, BSN, Treating RN: [place] [facility] [place], [place] Physician: BETH Other Clinician: Odelia Gage Treating Referring Physician: Lehman Prom Physician/Extender: Tania Ade in Treatment: 1 History of Present Illness Location: left lateral foot Quality: Patient reports  experiencing a sharp pain to affected area(s). Severity: Patient states wound are getting worse. Duration: Patient has had the wound for > 3 months prior to seeking treatment at the wound center Timing: Pain in wound is Intermittent (comes and goes Context: The wound appeared gradually over time Modifying Factors: Other treatment(s) tried include:local care and offloading Associated Signs and Symptoms: Patient reports having difficulty standing for long periods. HPI Description: 13-year-old patient who has history of advanced dementia and has been living at a nursing home for a long while. She was a smoker in the past and quit smoking at the age of 46 and has a past medical history of advanced dementia, hyponatremia, urinary tract infection, failure to thrive, macular degeneration and glaucoma. She is also status post appendectomy. 08/04/2015 -- had a left foot x-ray which shows no acute fracture or dislocation. There is no suspicious periostitis or cortical destruction Electronic Signature(s) Signed: 08/04/2015 2:01:47 PM By: Evlyn Kanner MD, FACS Entered By: Evlyn Kanner on 08/04/2015 14:01:46 Madeline Cruz ([id number]) -------------------------------------------------------------------------------- Physical Exam Details Patient Name: Madeline Cruz, Madeline Cruz 08/04/2015 12:45 Date of Service: PM Medical Record [id number] Number: Patient Account Number: [id number] Date of Birth/Sex: 06/11/1919 (82 y.o. Female) Afful, RN, BSN, Treating RN: [place] [facility] [place], [place] Physician: BETH Other Clinician: Odelia Gage Treating Referring Physician: Lehman Prom Physician/Extender: Tania Ade in Treatment: 1 Constitutional . Pulse regular. Respirations normal and unlabored. Afebrile. . Eyes Nonicteric. Reactive to light. Ears, Nose, Mouth, and Throat Lips, teeth, and gums WNL.Marland Kitchen Moist mucosa without lesions. Neck supple and nontender. No palpable supraclavicular or cervical adenopathy. Normal sized without goiter. Respiratory WNL. No retractions.. Cardiovascular Pedal Pulses WNL. No clubbing, cyanosis or edema. Lymphatic No adneopathy. No adenopathy. No adenopathy. Musculoskeletal Adexa without tenderness or enlargement.. Digits and nails w/o clubbing, cyanosis, infection, petechiae, ischemia, or inflammatory conditions.. Integumentary (Hair, Skin) No suspicious lesions. No crepitus or fluctuance. No peri-wound warmth or erythema. No masses.Marland Kitchen Psychiatric Judgement and insight Intact.. No evidence of depression, anxiety, or agitation.. Notes last week extensive dissection and debridement of the skin and subcutis tissue was done and today there is not much to remove but I have used a curette and removed as much of the slough as possible down to the subcutaneous tissue and bleeding was controlled with pressure. Electronic Signature(s) Signed: 08/04/2015 2:02:20 PM By: Evlyn Kanner MD, FACS Entered By: Evlyn Kanner on 08/04/2015 14:02:20 Madeline Cruz  ([id number]) -------------------------------------------------------------------------------- Physician Orders Details Patient Name: Madeline Cruz, Madeline Cruz 08/04/2015 12:45 Date of Service: PM Medical Record [id number] Number: Patient Account Number: [id number] Date of Birth/Sex: 1919/09/04 (82 y.o. Female) Afful, RN, BSN, Treating RN: [place] [facility] [place], [place] Physician: BETH Other Clinician: Odelia Gage Treating Referring Physician: Lehman Prom Physician/Extender: Tania Ade in Treatment: 1 Verbal / Phone Orders: Yes Clinician: Afful, RN, BSN, Rita Read Back and Verified: Yes Diagnosis Coding Wound Cleansing Wound #1 Left,Lateral Foot o Clean wound with Normal Saline. Wound #2 Left,Lateral Malleolus o Clean wound with Normal Saline. Anesthetic Wound #1 Left,Lateral Foot o Topical Lidocaine 4% cream applied to wound bed prior to debridement Wound #2 Left,Lateral Malleolus o Topical Lidocaine 4% cream applied to wound bed prior to debridement Skin Barriers/Peri-Wound Care Wound #1 Left,Lateral Foot o Skin Prep Wound #2 Left,Lateral Malleolus o Skin Prep Primary Wound Dressing Wound #1 Left,Lateral Foot o Santyl Ointment Wound #2 Left,Lateral Malleolus o Santyl Ointment Secondary Dressing Wound #1 Left,Lateral Foot o Boardered Foam Dressing Wound #2 Left,Lateral Malleolus Madeline Cruz, Madeline Cruz ([id number]) o Boardered Foam Dressing Dressing Change Frequency Wound #  1 Left,Lateral Foot o Change dressing every day. Wound #2 Left,Lateral Malleolus o Change dressing every day. Follow-up Appointments Wound #1 Left,Lateral Foot o Return Appointment in 1 week. Wound #2 Left,Lateral Malleolus o Return Appointment in 1 week. Off-Loading Wound #1 Left,Lateral Foot o Heel suspension boot to: - SAGE BOOTS Wound #2 Left,Lateral Malleolus o Heel suspension boot to: - SAGE BOOTS Additional Orders / Instructions Wound #1 Left,Lateral  Foot o Increase protein intake. Wound #2 Left,Lateral Malleolus o Increase protein intake. Home Health Wound #1 Left,Lateral Foot o Continue Home Health Visits - [facility] o Home Health Nurse may visit PRN to address patientos wound care needs. o FACE TO FACE ENCOUNTER: MEDICARE and MEDICAID PATIENTS: I certify that this patient is under my care and that I had a face-to-face encounter that meets the physician face-to-face encounter requirements with this patient on this date. The encounter with the patient was in whole or in part for the following MEDICAL CONDITION: (primary reason for Home Healthcare) MEDICAL NECESSITY: I certify, that based on my findings, NURSING services are a medically necessary home health service. HOME BOUND STATUS: I certify that my clinical findings support that this patient is homebound (i.e., Due to illness or injury, pt requires aid of supportive devices such as crutches, cane, wheelchairs, walkers, the use of special transportation or the assistance of another person to leave their place of residence. There is a normal inability to leave the home and doing so requires considerable and taxing effort. Other absences are for medical reasons / religious services and are infrequent or of short duration when for other reasons). Madeline Cruz, Madeline Cruz ([id number]) o If current dressing causes regression in wound condition, may D/C ordered dressing product/s and apply Normal Saline Moist Dressing daily until next Wound Healing Center / Other MD appointment. Notify Wound Healing Center of regression in wound condition at [phone]. o Please direct any NON-WOUND related issues/requests for orders to patient's Primary Care Physician Wound #2 Left,Lateral Malleolus o Continue Home Health Visits - [facility] and [facility] care o Home Health Nurse may visit PRN to address patientos wound care needs. o FACE TO FACE  ENCOUNTER: MEDICARE and MEDICAID PATIENTS: I certify that this patient is under my care and that I had a face-to-face encounter that meets the physician face-to-face encounter requirements with this patient on this date. The encounter with the patient was in whole or in part for the following MEDICAL CONDITION: (primary reason for Home Healthcare) MEDICAL NECESSITY: I certify, that based on my findings, NURSING services are a medically necessary home health service. HOME BOUND STATUS: I certify that my clinical findings support that this patient is homebound (i.e., Due to illness or injury, pt requires aid of supportive devices such as crutches, cane, wheelchairs, walkers, the use of special transportation or the assistance of another person to leave their place of residence. There is a normal inability to leave the home and doing so requires considerable and taxing effort. Other absences are for medical reasons / religious services and are infrequent or of short duration when for other reasons). o If current dressing causes regression in wound condition, may D/C ordered dressing product/s and apply Normal Saline Moist Dressing daily until next Wound Healing Center / Other MD appointment. Notify Wound Healing Center of regression in wound condition at [phone]. o Please direct any NON-WOUND related issues/requests for orders to patient's Primary Care Physician Medications-please add to medication list. Wound #1 Left,Lateral Foot o Santyl Enzymatic Ointment   o Other: - ZINC, ViTAMIN C, MVI Wound #2 Left,Lateral Malleolus o Santyl Enzymatic Ointment o Other: - ZINC, ViTAMIN C, MVI Electronic Signature(s) Signed: 08/04/2015 1:39:55 PM By: Elpidio Eric BSN, RN Signed: 08/04/2015 3:47:35 PM By: Evlyn Kanner MD, FACS Entered By: Elpidio Eric on 08/04/2015 13:19:31 Madeline Cruz  ([id number]) -------------------------------------------------------------------------------- Prescription 08/04/2015 Patient Name: Madeline Cruz Physician: Evlyn Kanner MD Date of Birth: 1919-06-30 NPI#: [id number] Sex: F DEA#: [id number] Phone #: [phone] License #: Patient Address: [facility] ATTN P[facility] [place] [address] [facility] [place], [place] [address] [address], [address] [place], [place] [address] [phone] Allergies sulfa Antibiotics Physician's Orders Santyl Enzymatic Ointment Signature(s): Date(s): Electronic Signature(s) Signed: 08/04/2015 1:39:55 PM By: Elpidio Eric BSN, RN Signed: 08/04/2015 3:47:35 PM By: Evlyn Kanner MD, FACS Entered By: Elpidio Eric on 08/04/2015 13:19:32 Madeline Cruz ([id number]) --------------------------------------------------------------------------------  Problem List Details Patient Name: Madeline Cruz, Madeline Cruz 08/04/2015 12:45 Date of Service: PM Medical Record [id number] Number: Patient Account Number: [id number] Date of Birth/Sex: [birth year] (82 y.o. Female) Afful, RN, BSN, Treating RN: [place] [facility] [place], [place] Physician: BETH Other Clinician: Odelia Gage Treating Referring Physician: Lehman Prom Physician/Extender: Tania Ade in Treatment: 1 Active Problems ICD-10 Encounter Code Description Active Date Diagnosis L89.523 Pressure ulcer of left ankle, stage 3 07/28/2015 Yes G30.1 Alzheimer's disease with late onset 07/28/2015 Yes Z87.891 Personal history of nicotine dependence 07/28/2015 Yes I70.245 Atherosclerosis of native arteries of left leg with ulceration 07/28/2015 Yes of other part of foot Inactive Problems Resolved Problems Electronic Signature(s) Signed: 08/04/2015 2:00:57 PM By: Evlyn Kanner MD, FACS Entered By: Evlyn Kanner on 08/04/2015 14:00:57 Madeline Cruz  ([id number]) -------------------------------------------------------------------------------- Progress Note Details Patient Name: Madeline Cruz, Madeline Cruz 08/04/2015 12:45 Date of Service: PM Medical Record [id number] Number: Patient Account Number: [id number] Date of Birth/Sex: 1919/07/26 (82 y.o. Female) Afful, RN, BSN, Treating RN:  Sink Primary Care [place], [place] Physician: BETH Other Clinician: Odelia Gage Treating Referring Physician: Lehman Prom Physician/Extender: Tania Ade in Treatment: 1 Subjective Chief Complaint Information obtained from Patient Patient is at the clinic for treatment of an open pressure ulcer with a arterial competent and this has been there on the left foot since the last 3 months History of Present Illness (HPI) The following HPI elements were documented for the patient's wound: Location: left lateral foot Quality: Patient reports experiencing a sharp pain to affected area(s). Severity: Patient states wound are getting worse. Duration: Patient has had the wound for > 3 months prior to seeking treatment at the wound center Timing: Pain in wound is Intermittent (comes and goes Context: The wound appeared gradually over time Modifying Factors: Other treatment(s) tried include:local care and offloading Associated Signs and Symptoms: Patient reports having difficulty standing for long periods. 6-year-old patient who has history of advanced dementia and has been living at a nursing home for a long while. She was a smoker in the past and quit smoking at the age of 80 and has a past medical history of advanced dementia, hyponatremia, urinary tract infection, failure to thrive, macular degeneration and glaucoma. She is also status post appendectomy. 08/04/2015 -- had a left foot x-ray which shows no acute fracture or dislocation. There is no suspicious periostitis or cortical destruction Objective Constitutional Pulse regular. Respirations normal and  unlabored. Afebrile. Vitals Time Taken: 1:00 PM, Height: 59 in, Temperature: 97.8 F, Pulse: 66 bpm, Respiratory Rate: 17 Madeline Cruz, Madeline Cruz ([id number]) breaths/min, Blood Pressure: 112/62 mmHg. Eyes Nonicteric. Reactive to light. Ears, Nose, Mouth, and Throat Lips,  teeth, and gums WNL.Marland Kitchen Moist mucosa without lesions. Neck supple and nontender. No palpable supraclavicular or cervical adenopathy. Normal sized without goiter. Respiratory WNL. No retractions.. Cardiovascular Pedal Pulses WNL. No clubbing, cyanosis or edema. Lymphatic No adneopathy. No adenopathy. No adenopathy. Musculoskeletal Adexa without tenderness or enlargement.. Digits and nails w/o clubbing, cyanosis, infection, petechiae, ischemia, or inflammatory conditions.Marland Kitchen Psychiatric Judgement and insight Intact.. No evidence of depression, anxiety, or agitation.. General Notes: last week extensive dissection and debridement of the skin and subcutis tissue was done and today there is not much to remove but I have used a curette and removed as much of the slough as possible down to the subcutaneous tissue and bleeding was controlled with pressure. Integumentary (Hair, Skin) No suspicious lesions. No crepitus or fluctuance. No peri-wound warmth or erythema. No masses.. Wound #1 status is Open. Original cause of wound was Pressure Injury. The wound is located on the Left,Lateral Foot. The wound measures 3.2cm length x 2.3cm width x 0.2cm depth; 5.781cm^2 area and 1.156cm^3 volume. The wound is limited to skin breakdown. There is no tunneling or undermining noted. There is a large amount of serosanguineous drainage noted. The wound margin is distinct with the outline attached to the wound base. There is no granulation within the wound bed. There is a large (67-100%) amount of necrotic tissue within the wound bed including Adherent Slough. The periwound skin appearance exhibited: Localized Edema, Maceration, Moist. The periwound  skin appearance did not exhibit: Callus, Crepitus, Excoriation, Fluctuance, Friable, Induration, Rash, Scarring, Dry/Scaly, Atrophie Blanche, Cyanosis, Ecchymosis, Hemosiderin Staining, Mottled, Pallor, Rubor, Erythema. Periwound temperature was noted as No Abnormality. The periwound has tenderness on palpation. Wound #2 status is Open. Original cause of wound was Pressure Injury. The wound is located on the Left,Lateral Malleolus. The wound measures 2.5cm length x 2cm width x 0.2cm depth; 3.927cm^2 area and 0.785cm^3 volume. The wound is limited to skin breakdown. There is no tunneling or undermining noted. There is a medium amount of serosanguineous drainage noted. The wound margin is distinct with the ANNA, BEAIRD ([id number]) outline attached to the wound base. There is no granulation within the wound bed. There is a large (67- 100%) amount of necrotic tissue within the wound bed including Adherent Slough. The periwound skin appearance exhibited: Localized Edema, Maceration, Moist. The periwound skin appearance did not exhibit: Callus, Crepitus, Excoriation, Fluctuance, Friable, Induration, Rash, Scarring, Dry/Scaly, Atrophie Blanche, Cyanosis, Ecchymosis, Hemosiderin Staining, Mottled, Pallor, Rubor, Erythema. Periwound temperature was noted as No Abnormality. The periwound has tenderness on palpation. Assessment Active Problems ICD-10 L89.523 - Pressure ulcer of left ankle, stage 3 G30.1 - Alzheimer's disease with late onset Z87.891 - Personal history of nicotine dependence I70.245 - Atherosclerosis of native arteries of left leg with ulceration of other part of foot Procedures Wound #1 Wound #1 is a Pressure Ulcer located on the Left,Lateral Foot . There was a Skin/Subcutaneous Tissue Debridement ([id number]) debridement with total area of 7.36 sq cm performed by Evlyn Kanner, MD. with the following instrument(s): Curette to remove Viable and Non-Viable tissue/material including  Exudate, Fibrin/Slough, and Subcutaneous after achieving pain control using Lidocaine 4% Topical Solution. A time out was conducted prior to the start of the procedure. A Minimum amount of bleeding was controlled with Pressure. The procedure was tolerated well with a pain level of 0 throughout and a pain level of 0 following the procedure. Post Debridement Measurements: 3.2cm length x 2.3cm width x 0.2cm depth; 1.156cm^3 volume. Post debridement Stage noted as Category/Stage III. Post procedure  Diagnosis Wound #1: Same as Pre-Procedure Wound #2 Wound #2 is a Pressure Ulcer located on the Left,Lateral Malleolus . There was a Skin/Subcutaneous Tissue Debridement ([id number]) debridement with total area of 5 sq cm performed by Evlyn KannerBritto, Elen Acero, MD. with the following instrument(s): Curette to remove Non-Viable tissue/material including Exudate, Fibrin/Slough, and Subcutaneous after achieving pain control using Lidocaine 4% Topical Solution. A time out was conducted prior to the start of the procedure. A Minimum amount of bleeding was controlled with Pressure. The procedure was tolerated well with a pain level of 0 throughout and a pain level of 0 following the procedure. Post Debridement Measurements: 2.5cm length x 2cm width x 0.2cm depth; 0.785cm^3 volume. Post debridement Stage noted as Unstageable/Unclassified. Post procedure Diagnosis Wound #2: Same as Pre-Procedure Madeline RoupUCKER, Gerald ([id number]) Plan Wound Cleansing: Wound #1 Left,Lateral Foot: Clean wound with Normal Saline. Wound #2 Left,Lateral Malleolus: Clean wound with Normal Saline. Anesthetic: Wound #1 Left,Lateral Foot: Topical Lidocaine 4% cream applied to wound bed prior to debridement Wound #2 Left,Lateral Malleolus: Topical Lidocaine 4% cream applied to wound bed prior to debridement Skin Barriers/Peri-Wound Care: Wound #1 Left,Lateral Foot: Skin Prep Wound #2 Left,Lateral Malleolus: Skin Prep Primary Wound  Dressing: Wound #1 Left,Lateral Foot: Santyl Ointment Wound #2 Left,Lateral Malleolus: Santyl Ointment Secondary Dressing: Wound #1 Left,Lateral Foot: Boardered Foam Dressing Wound #2 Left,Lateral Malleolus: Boardered Foam Dressing Dressing Change Frequency: Wound #1 Left,Lateral Foot: Change dressing every day. Wound #2 Left,Lateral Malleolus: Change dressing every day. Follow-up Appointments: Wound #1 Left,Lateral Foot: Return Appointment in 1 week. Wound #2 Left,Lateral Malleolus: Return Appointment in 1 week. Off-Loading: Wound #1 Left,Lateral Foot: Heel suspension boot to: - SAGE BOOTS Wound #2 Left,Lateral Malleolus: Heel suspension boot to: - SAGE BOOTS Additional Orders / Instructions: Wound #1 Left,Lateral Foot: Madeline RoupUCKER, Madeline Cruz ([id number]) Increase protein intake. Wound #2 Left,Lateral Malleolus: Increase protein intake. Home Health: Wound #1 Left,Lateral Foot: Continue Home Health Visits - [facility] Home Health Nurse may visit PRN to address patient s wound care needs. FACE TO FACE ENCOUNTER: MEDICARE and MEDICAID PATIENTS: I certify that this patient is under my care and that I had a face-to-face encounter that meets the physician face-to-face encounter requirements with this patient on this date. The encounter with the patient was in whole or in part for the following MEDICAL CONDITION: (primary reason for Home Healthcare) MEDICAL NECESSITY: I certify, that based on my findings, NURSING services are a medically necessary home health service. HOME BOUND STATUS: I certify that my clinical findings support that this patient is homebound (i.e., Due to illness or injury, pt requires aid of supportive devices such as crutches, cane, wheelchairs, walkers, the use of special transportation or the assistance of another person to leave their place of residence. There is a normal inability to leave the home and doing so requires considerable  and taxing effort. Other absences are for medical reasons / religious services and are infrequent or of short duration when for other reasons). If current dressing causes regression in wound condition, may D/C ordered dressing product/s and apply Normal Saline Moist Dressing daily until next Wound Healing Center / Other MD appointment. Notify Wound Healing Center of regression in wound condition at [phone]. Please direct any NON-WOUND related issues/requests for orders to patient's Primary Care Physician Wound #2 Left,Lateral Malleolus: Continue Home Health Visits - [facility] Home Health Nurse may visit PRN to address patient s wound care needs. FACE TO FACE ENCOUNTER: MEDICARE and MEDICAID PATIENTS:  I certify that this patient is under my care and that I had a face-to-face encounter that meets the physician face-to-face encounter requirements with this patient on this date. The encounter with the patient was in whole or in part for the following MEDICAL CONDITION: (primary reason for Home Healthcare) MEDICAL NECESSITY: I certify, that based on my findings, NURSING services are a medically necessary home health service. HOME BOUND STATUS: I certify that my clinical findings support that this patient is homebound (i.e., Due to illness or injury, pt requires aid of supportive devices such as crutches, cane, wheelchairs, walkers, the use of special transportation or the assistance of another person to leave their place of residence. There is a normal inability to leave the home and doing so requires considerable and taxing effort. Other absences are for medical reasons / religious services and are infrequent or of short duration when for other reasons). If current dressing causes regression in wound condition, may D/C ordered dressing product/s and apply Normal Saline Moist Dressing daily until next Wound Healing Center / Other MD appointment. Notify  Wound Healing Center of regression in wound condition at [phone]. Please direct any NON-WOUND related issues/requests for orders to patient's Primary Care Physician Medications-please add to medication list.: Wound #1 Left,Lateral Foot: Santyl Enzymatic Ointment Other: - ZINC, ViTAMIN C, MVI Wound #2 Left,Lateral Malleolus: Santyl Enzymatic Ointment Other: - ZINC, ViTAMIN C, MVI Nance, Annaleese ([id number]) I have recommended: 1. Santyl ointment locally to be applied to the heel and ankle on a daily basis. 2. offloading techniques have been discussed in great detail 3. Adequate protein intake and vitamin supplements including vitamin C and zinc 4. x-ray of the left foot --report discussed with the daughter who understands that there is no bone infection. Electronic Signature(s) Signed: 08/04/2015 2:02:56 PM By: Evlyn Kanner MD, FACS Entered By: Evlyn Kanner on 08/04/2015 14:02:56 Madeline Cruz ([id number]) -------------------------------------------------------------------------------- SuperBill Details Patient Name: Madeline Cruz Date of Service: 08/04/2015 Medical Record Patient Account Number: [id number] Number: Afful, RN, BSN, Treating RN: Date of Birth/Sex: 01-31-1920 (82 y.o. Female) [facility], [place] Other Clinician: Physician: BETH Treating Divante Kotch, Threasa Alpha, MARY Physician/Extender: Referring Physician: Derald Macleod in Treatment: 1 Diagnosis Coding ICD-10 Codes Code Description [phone] Pressure ulcer of left ankle, stage 3 G30.1 Alzheimer's disease with late onset Z87.891 Personal history of nicotine dependence I70.245 Atherosclerosis of native arteries of left leg with ulceration of other part of foot Facility Procedures CPT4: Description Modifier Quantity Code [id number] 11042 - DEB SUBQ TISSUE 20 SQ CM/< 1 ICD-10 Description Diagnosis L89.523 Pressure ulcer of left ankle, stage 3 G30.1 Alzheimer's disease with late onset  Z87.891 Personal history of nicotine dependence  I70.245 Atherosclerosis of native arteries of left leg with ulceration of other part of foot Physician Procedures CPT4: Description Modifier Quantity Code [id number] 11042 - WC PHYS SUBQ TISS 20 SQ CM 1 ICD-10 Description Diagnosis L89.523 Pressure ulcer of left ankle, stage 3 G30.1 Alzheimer's disease with late onset Z87.891 Personal history of nicotine dependence  I70.245 Atherosclerosis of native arteries of left leg with ulceration of other part of foot Electronic Signature(s) LETIZIA, HOOK ([id number]) Signed: 08/04/2015 2:03:07 PM By: Evlyn Kanner MD, FACS Entered By: Evlyn Kanner on 08/04/2015 14:03:07

## 2015-08-05 NOTE — Progress Notes (Addendum)
TYKIRA, WACHS ([id number]) Visit Report for 08/04/2015 Arrival Information Details Patient Name: Madeline Cruz, Madeline Cruz 08/04/2015 12:45 Date of Service: PM Medical Record [id number] Number: Patient Account Number: [id number] Date of Birth/Sex: 05/22/1920 (82 y.o. Female) Afful, RN, BSN, Treating RN: [place] [facility] [place], [place] Physician: BETH Other Clinician: Odelia Gage Treating Referring Physician: Lehman Prom Physician/Extender: Tania Ade in Treatment: 1 Visit Information History Since Last Visit Added or deleted any medications: No Patient Arrived: Wheel Chair Any new allergies or adverse reactions: No Arrival Time: 12:53 Had a fall or experienced change in No activities of daily living that may affect Accompanied By: dtr risk of falls: Transfer Assistance: None Has Dressing in Place as Prescribed: Yes Patient Identification Verified: Yes Pain Present Now: No Secondary Verification Process Yes Completed: Patient Requires Transmission-Based No Precautions: Patient Has Alerts: No Electronic Signature(s) Signed: 08/04/2015 1:39:55 PM By: Elpidio Eric BSN, RN Entered By: Elpidio Eric on 08/04/2015 12:56:22 [name] ([id number]) -------------------------------------------------------------------------------- Lower Extremity Assessment Details Patient Name: Madeline, Cruz 08/04/2015 12:45 Date of Service: PM Medical Record [id number] Number: Patient Account Number: [id number] Date of Birth/Sex: June 20, 1919 (82 y.o. Female) Afful, RN, BSN, Treating RN: [place] [facility] [place], [place] Physician: BETH Other Clinician: Odelia Gage Treating Referring Physician: Evlyn Kanner BETH Physician/Extender: Tania Ade in Treatment: 1 Vascular Assessment Pulses: Posterior Tibial Dorsalis Pedis Palpable: [Left:Yes] Extremity colors, hair growth, and conditions: Extremity Color: [Left:Normal] Hair Growth on Extremity: [Left:No] Temperature of Extremity:  [Left:Warm] Capillary Refill: [Left:< 3 seconds] Toe Nail Assessment Left: Right: Thick: Yes Discolored: Yes Deformed: No Improper Length and Hygiene: No Electronic Signature(s) Signed: 08/04/2015 1:04:51 PM By: Elpidio Eric BSN, RN Entered By: Elpidio Eric on 08/04/2015 13:04:51 [name] ([id number]) -------------------------------------------------------------------------------- Multi Wound Chart Details Patient Name: Madeline, Cruz 08/04/2015 12:45 Date of Service: PM Medical Record [id number] Number: Patient Account Number: [id number] Date of Birth/Sex: [birth year] (82 y.o. Female) Afful, RN, BSN, Treating RN: [place] [facility] [place], [place] Physician: BETH Other Clinician: Odelia Gage Treating Referring Physician: Lehman Prom Physician/Extender: Tania Ade in Treatment: 1 Photos: [1:No Photos] [2:No Photos] [N/A:N/A] Wound Location: [1:Left Foot - Lateral] [2:Left Malleolus - Lateral] [N/A:N/A] Wounding Event: [1:Pressure Injury] [2:Pressure Injury] [N/A:N/A] Primary Etiology: [1:Pressure Ulcer] [2:Pressure Ulcer] [N/A:N/A] Comorbid History: [1:Cataracts, Asthma, Arrhythmia, History of pressure wounds, Osteoarthritis, Dementia] [2:Cataracts, Asthma, Arrhythmia, History of pressure wounds, Osteoarthritis, Dementia] [N/A:N/A] Date Acquired: [1:05/10/2015] [2:05/10/2015] [N/A:N/A] Weeks of Treatment: [1:1] [2:1] [N/A:N/A] Wound Status: [1:Open] [2:Open] [N/A:N/A] Measurements L x W x D 3.2x2.3x0.2 [2:2.5x2x0.2] [N/A:N/A] (cm) Area (cm) : [1:5.781] [2:3.927] [N/A:N/A] Volume (cm) : [1:1.156] [2:0.785] [N/A:N/A] % Reduction in Area: [1:-22.70%] [2:0.00%] [N/A:N/A] % Reduction in Volume: -22.70% [2:0.00%] [N/A:N/A] Classification: [1:Unstageable/Unclassified] [2:Unstageable/Unclassified] [N/A:N/A] Exudate Amount: [1:Large] [2:Medium] [N/A:N/A] Exudate Type: [1:Serosanguineous] [2:Serosanguineous] [N/A:N/A] Exudate Color: [1:red, brown] [2:red, brown]  [N/A:N/A] Wound Margin: [1:Distinct, outline attached] [2:Distinct, outline attached] [N/A:N/A] Granulation Amount: [1:None Present (0%)] [2:None Present (0%)] [N/A:N/A] Necrotic Amount: [1:Large (67-100%)] [2:Large (67-100%)] [N/A:N/A] Exposed Structures: [1:Fascia: No Fat: No Tendon: No Muscle: No Joint: No Bone: No Limited to Skin Breakdown] [2:Fascia: No Fat: No Tendon: No Muscle: No Joint: No Bone: No Limited to Skin Breakdown] [N/A:N/A] Epithelialization: [1:None] [2:None] [N/A:N/A] Periwound Skin Texture: Edema: Yes [1:Excoriation: No] [2:Edema: Yes Excoriation: No] [N/A:N/A] Induration: No Induration: No Callus: No Callus: No Crepitus: No Crepitus: No Fluctuance: No Fluctuance: No Friable: No Friable: No Rash: No Rash: No Scarring: No Scarring: No Periwound Skin Maceration: Yes Maceration: Yes N/A Moisture: Moist: Yes Moist: Yes Dry/Scaly: No Dry/Scaly: No Periwound Skin Color: Atrophie Blanche: No Atrophie Blanche:  No N/A Cyanosis: No Cyanosis: No Ecchymosis: No Ecchymosis: No Erythema: No Erythema: No Hemosiderin Staining: No Hemosiderin Staining: No Mottled: No Mottled: No Pallor: No Pallor: No Rubor: No Rubor: No Temperature: No Abnormality No Abnormality N/A Tenderness on Yes Yes N/A Palpation: Wound Preparation: Ulcer Cleansing: Ulcer Cleansing: N/A Rinsed/Irrigated with Rinsed/Irrigated with Saline Saline Topical Anesthetic Topical Anesthetic Applied: Other: lidocaine Applied: Other: lidocaine 4% 4% Treatment Notes Electronic Signature(s) Signed: 08/04/2015 1:39:55 PM By: Elpidio Eric BSN, RN Entered By: Elpidio Eric on 08/04/2015 12:59:37 [name] ([id number]) -------------------------------------------------------------------------------- Multi-Disciplinary Care Plan Details Patient Name: Madeline, Cruz 08/04/2015 12:45 Date of Service: PM Medical Record [id number] Number: Patient Account Number: [id number] Date of Birth/Sex:  04/24/1920 (82 y.o. Female) Afful, RN, BSN, Treating RN: [place] [facility] [place], [place] Physician: BETH Other Clinician: Odelia Gage Treating Referring Physician: Lehman Prom Physician/Extender: Tania Ade in Treatment: 1 Active Inactive Orientation to the Wound Care Program Nursing Diagnoses: Knowledge deficit related to the wound healing center program Goals: Patient/caregiver will verbalize understanding of the Wound Healing Center Program Date Initiated: 07/28/2015 Goal Status: Active Interventions: Provide education on orientation to the wound center Notes: Pressure Nursing Diagnoses: Knowledge deficit related to causes and risk factors for pressure ulcer development Knowledge deficit related to management of pressures ulcers Potential for impaired tissue integrity related to pressure, friction, moisture, and shear Goals: Patient will remain free from development of additional pressure ulcers Date Initiated: 07/28/2015 Goal Status: Active Patient will remain free of pressure ulcers Date Initiated: 07/28/2015 Goal Status: Active Patient/caregiver will verbalize risk factors for pressure ulcer development Date Initiated: 07/28/2015 Goal Status: Active Patient/caregiver will verbalize understanding of pressure ulcer management Date Initiated: 07/28/2015 [name] ([id number]) Goal Status: Active Interventions: Assess: immobility, friction, shearing, incontinence upon admission and as needed Assess offloading mechanisms upon admission and as needed Assess potential for pressure ulcer upon admission and as needed Provide education on pressure ulcers Treatment Activities: Patient referred for home evaluation of offloading devices/mattresses : 08/04/2015 Patient referred for pressure reduction/relief devices : 08/04/2015 Patient referred for seating evaluation to ensure proper offloading : 08/04/2015 Pressure reduction/relief device ordered :  08/04/2015 Notes: Wound/Skin Impairment Nursing Diagnoses: Impaired tissue integrity Knowledge deficit related to smoking impact on wound healing Knowledge deficit related to ulceration/compromised skin integrity Goals: Patient/caregiver will verbalize understanding of skin care regimen Date Initiated: 07/28/2015 Goal Status: Active Ulcer/skin breakdown will have a volume reduction of 30% by week 4 Date Initiated: 07/28/2015 Goal Status: Active Ulcer/skin breakdown will have a volume reduction of 50% by week 8 Date Initiated: 07/28/2015 Goal Status: Active Ulcer/skin breakdown will have a volume reduction of 80% by week 12 Date Initiated: 07/28/2015 Goal Status: Active Ulcer/skin breakdown will heal within 14 weeks Date Initiated: 07/28/2015 Goal Status: Active Interventions: Assess patient/caregiver ability to perform ulcer/skin care regimen upon admission and as needed Assess ulceration(s) every visit Provide education on ulcer and skin care JOCLYNN, LUMB ([id number]) Treatment Activities: Skin care regimen initiated : 08/04/2015 Topical wound management initiated : 08/04/2015 Notes: Electronic Signature(s) Signed: 08/04/2015 1:39:55 PM By: Elpidio Eric BSN, RN Entered By: Elpidio Eric on 08/04/2015 12:59:04 [name] ([id number]) -------------------------------------------------------------------------------- Pain Assessment Details Patient Name: MORENA, MCKISSACK 08/04/2015 12:45 Date of Service: PM Medical Record [id number] Number: Patient Account Number: [id number] Date of Birth/Sex: 08-Nov-1919 (82 y.o. Female) Afful, RN, BSN, Treating RN: [place] [facility] [place], [place] Physician: BETH Other Clinician: Odelia Gage Treating Referring Physician: Lehman Prom Physician/Extender: Tania Ade in Treatment: 1 Active Problems Location of Pain Severity and Description of  Pain Patient Has Paino No Site Locations Pain Management and Medication Current Pain  Management: Electronic Signature(s) Signed: 08/04/2015 1:39:55 PM By: Elpidio EricAfful, Rita BSN, RN Entered By: Elpidio EricAfful, Rita on 08/04/2015 12:56:29 [name], Naela ([id number]) -------------------------------------------------------------------------------- Wound Assessment Details Patient Name: [name], Aslan 08/04/2015 12:45 Date of Service: PM Medical Record [id number] Number: Patient Account Number: [id number] Date of Birth/Sex: [birth year] (82 y.o. Female) Afful, RN, BSN, Treating RN: [place] [facility] [place] Physician: BETH Other Clinician: Odelia GageMCGRANAGHAN, MARY Treating Referring Physician: Evlyn KannerBritto, Errol BETH Physician/Extender: Tania AdeWeeks in Treatment: 1 Wound Status Wound Number: 1 Primary Pressure Ulcer Etiology: Wound Location: Left Foot - Lateral Wound Open Wounding Event: Pressure Injury Status: Date Acquired: 05/10/2015 Comorbid Cataracts, Asthma, Arrhythmia, History Weeks Of Treatment: 1 History: of pressure wounds, Osteoarthritis, Clustered Wound: No Dementia Photos Photo Uploaded By: Elpidio EricAfful, Rita on 08/04/2015 15:26:29 Wound Measurements Length: (cm) 3.2 Width: (cm) 2.3 Depth: (cm) 0.2 Area: (cm) 5.781 Volume: (cm) 1.156 % Reduction in Area: -22.7% % Reduction in Volume: -22.7% Epithelialization: None Tunneling: No Undermining: No Wound Description Classification: Unstageable/Unclassified [name], Khylah ([id number]) Foul Odor After Cleansing: No Wound Margin: Distinct, outline attached Exudate Amount: Large Exudate Type: Serosanguineous Exudate Color: red, brown Wound Bed Granulation Amount: None Present (0%) Exposed Structure Necrotic Amount: Large (67-100%) Fascia Exposed: No Necrotic Quality: Adherent Slough Fat Layer Exposed: No Tendon Exposed: No Muscle Exposed: No Joint Exposed: No Bone Exposed: No Limited to Skin Breakdown Periwound Skin Texture Texture Color No Abnormalities Noted: No No Abnormalities Noted: No Callus: No Atrophie  Blanche: No Crepitus: No Cyanosis: No Excoriation: No Ecchymosis: No Fluctuance: No Erythema: No Friable: No Hemosiderin Staining: No Induration: No Mottled: No Localized Edema: Yes Pallor: No Rash: No Rubor: No Scarring: No Temperature / Pain Moisture Temperature: No Abnormality No Abnormalities Noted: No Tenderness on Palpation: Yes Dry / Scaly: No Maceration: Yes Moist: Yes Wound Preparation Ulcer Cleansing: Rinsed/Irrigated with Saline Topical Anesthetic Applied: Other: lidocaine 4%, Electronic Signature(s) Signed: 08/04/2015 1:39:55 PM By: Elpidio EricAfful, Rita BSN, RN Entered By: Elpidio EricAfful, Rita on 08/04/2015 12:58:49 [name], Ayame ([id number]) -------------------------------------------------------------------------------- Wound Assessment Details Patient Name: [name], Cyndie 08/04/2015 12:45 Date of Service: PM Medical Record [id number] Number: Patient Account Number: [id number] Date of Birth/Sex: [birth year] (82 y.o. Female) Afful, RN, BSN, Treating RN: [place] [facility] [place] Physician: BETH Other Clinician: Odelia GageMCGRANAGHAN, MARY Treating Referring Physician: Evlyn KannerBritto, Errol BETH Physician/Extender: Tania AdeWeeks in Treatment: 1 Wound Status Wound Number: 2 Primary Pressure Ulcer Etiology: Wound Location: Left Malleolus - Lateral Wound Open Wounding Event: Pressure Injury Status: Date Acquired: 05/10/2015 Comorbid Cataracts, Asthma, Arrhythmia, History Weeks Of Treatment: 1 History: of pressure wounds, Osteoarthritis, Clustered Wound: No Dementia Photos Photo Uploaded By: Elpidio EricAfful, Rita on 08/04/2015 15:26:29 Wound Measurements Length: (cm) 2.5 Width: (cm) 2 Depth: (cm) 0.2 Area: (cm) 3.927 Volume: (cm) 0.785 % Reduction in Area: 0% % Reduction in Volume: 0% Epithelialization: None Tunneling: No Undermining: No Wound Description Classification: Unstageable/Unclassified [name], Zion ([id number]) Foul Odor After Cleansing: No Wound Margin:  Distinct, outline attached Exudate Amount: Medium Exudate Type: Serosanguineous Exudate Color: red, brown Wound Bed Granulation Amount: None Present (0%) Exposed Structure Necrotic Amount: Large (67-100%) Fascia Exposed: No Necrotic Quality: Adherent Slough Fat Layer Exposed: No Tendon Exposed: No Muscle Exposed: No Joint Exposed: No Bone Exposed: No Limited to Skin Breakdown Periwound Skin Texture Texture Color No Abnormalities Noted: No No Abnormalities Noted: No Callus: No Atrophie Blanche: No Crepitus: No Cyanosis: No Excoriation: No Ecchymosis: No Fluctuance: No Erythema: No Friable: No Hemosiderin Staining: No Induration: No Mottled: No  Localized Edema: Yes Pallor: No Rash: No Rubor: No Scarring: No Temperature / Pain Moisture Temperature: No Abnormality No Abnormalities Noted: No Tenderness on Palpation: Yes Dry / Scaly: No Maceration: Yes Moist: Yes Wound Preparation Ulcer Cleansing: Rinsed/Irrigated with Saline Topical Anesthetic Applied: Other: lidocaine 4%, Electronic Signature(s) Signed: 08/04/2015 1:39:55 PM By: Elpidio Eric BSN, RN Entered By: Elpidio Eric on 08/04/2015 12:58:59 [name] ([id number]) -------------------------------------------------------------------------------- Vitals Details Patient Name: NEALY, HICKMON 08/04/2015 12:45 Date of Service: PM Medical Record [id number] Number: Patient Account Number: [id number] Date of Birth/Sex: Apr 08, 1920 (82 y.o. Female) Afful, RN, BSN, Treating RN: [place] [facility] [place], [place] Physician: BETH Other Clinician: Odelia Gage Treating Referring Physician: Lehman Prom Physician/Extender: Tania Ade in Treatment: 1 Vital Signs Time Taken: 13:00 Temperature (F): 97.8 Height (in): 59 Pulse (bpm): 66 Respiratory Rate (breaths/min): 17 Blood Pressure (mmHg): 112/62 Reference Range: 80 - 120 mg / dl Electronic Signature(s) Signed: 08/04/2015 1:04:10 PM By: Elpidio Eric  BSN, RN Entered By: Elpidio Eric on 08/04/2015 13:04:10

## 2015-08-11 ENCOUNTER — Encounter: Payer: Medicare Other | Admitting: Surgery

## 2015-08-11 DIAGNOSIS — I70245 Atherosclerosis of native arteries of left leg with ulceration of other part of foot: Secondary | ICD-10-CM | POA: Diagnosis not present

## 2015-08-11 NOTE — Progress Notes (Addendum)
Madeline Cruz ([id number]) Visit Report for 08/11/2015 Arrival Information Details Patient Name: Madeline Cruz, Madeline Cruz 08/11/2015 12:45 Date of Service: PM Medical Record [id number] Number: Patient Account Number: [id number] Date of Birth/Sex: 1919-06-20 (82 y.o. Female) Afful, RN, BSN, Treating RN: [place] Sink [facility] [place], [place] Physician: BETH Other Clinician: Odelia Gage Treating Referring Physician: Lehman Prom Physician/Extender: Tania Ade in Treatment: 2 Visit Information History Since Last Visit Added or deleted any medications: No Patient Arrived: Wheel Chair Any new allergies or adverse reactions: No Arrival Time: 12:47 Had a fall or experienced change in No activities of daily living that may affect Accompanied By: dtr risk of falls: Transfer Assistance: None Signs or symptoms of abuse/neglect since last No Patient Identification Verified: Yes visito Secondary Verification Process Yes Hospitalized since last visit: No Completed: Has Dressing in Place as Prescribed: Yes Patient Requires Transmission-Based No Pain Present Now: No Precautions: Patient Has Alerts: No Electronic Signature(s) Signed: 08/11/2015 12:47:25 PM By: Elpidio Eric BSN, RN Entered By: Elpidio Eric on 08/11/2015 12:47:25 [name] ([id number]) -------------------------------------------------------------------------------- Encounter Discharge Information Details Patient Name: Madeline Cruz 08/11/2015 12:45 Date of Service: PM Medical Record [id number] Number: Patient Account Number: [id number] Date of Birth/Sex: 08-16-1919 (82 y.o. Female) Afful, RN, BSN, Treating RN: [place] Sink Primary Care [place], [place] Physician: BETH Other Clinician: Odelia Gage Treating Referring Physician: Lehman Prom Physician/Extender: Tania Ade in Treatment: 2 Encounter Discharge Information Items Discharge Pain Level: 0 Discharge Condition: Stable Ambulatory Status:  Wheelchair Nursing Discharge Destination: Home Transportation: Other Accompanied By: dtr Schedule Follow-up Appointment: No Medication Reconciliation completed No and provided to Patient/Care Nkechi Linehan: Clinical Summary of Care: Electronic Signature(s) Signed: 08/11/2015 3:58:38 PM By: Elpidio Eric BSN, RN Entered By: Elpidio Eric on 08/11/2015 13:18:53 [name] ([id number]) -------------------------------------------------------------------------------- Lower Extremity Assessment Details Patient Name: Madeline Cruz 08/11/2015 12:45 Date of Service: PM Medical Record [id number] Number: Patient Account Number: [id number] Date of Birth/Sex: 1919-11-20 (82 y.o. Female) Afful, RN, BSN, Treating RN: [place] Sink Primary [facility] [place], [place] Physician: BETH Other Clinician: Odelia Gage Treating Referring Physician: Evlyn Kanner BETH Physician/Extender: Tania Ade in Treatment: 2 Vascular Assessment Pulses: Posterior Tibial Dorsalis Pedis Palpable: [Left:Yes] Extremity colors, hair growth, and conditions: Extremity Color: [Left:Normal] Hair Growth on Extremity: [Left:Yes] Temperature of Extremity: [Left:Warm] Capillary Refill: [Left:< 3 seconds] Toe Nail Assessment Left: Right: Thick: Yes Discolored: Yes Deformed: No Improper Length and Hygiene: No Electronic Signature(s) Signed: 08/11/2015 12:47:49 PM By: Elpidio Eric BSN, RN Entered By: Elpidio Eric on 08/11/2015 12:47:48 [name] ([id number]) -------------------------------------------------------------------------------- Multi Wound Chart Details Patient Name: Madeline Cruz 08/11/2015 12:45 Date of Service: PM Medical Record [id number] Number: Patient Account Number: [id number] Date of Birth/Sex: 1920/02/06 (82 y.o. Female) Afful, RN, BSN, Treating RN: [place] Sink [facility] [place], [place] Physician: BETH Other Clinician: Odelia Gage Treating Referring Physician: Lehman Prom  Physician/Extender: Tania Ade in Treatment: 2 Vital Signs Height(in): 59 Pulse(bpm): 73 Weight(lbs): Blood Pressure 131/45 (mmHg): Body Mass Index(BMI): Temperature(F): Respiratory Rate 17 (breaths/min): Photos: [1:No Photos] [2:No Photos] [N/A:N/A] Wound Location: [1:Left Foot - Lateral] [2:Left Malleolus - Lateral] [N/A:N/A] Wounding Event: [1:Pressure Injury] [2:Pressure Injury] [N/A:N/A] Primary Etiology: [1:Pressure Ulcer] [2:Pressure Ulcer] [N/A:N/A] Comorbid History: [1:Cataracts, Asthma, Arrhythmia, History of pressure wounds, Osteoarthritis, Dementia] [2:Cataracts, Asthma, Arrhythmia, History of pressure wounds, Osteoarthritis, Dementia] [N/A:N/A] Date Acquired: [1:05/10/2015] [2:05/10/2015] [N/A:N/A] Weeks of Treatment: [1:2] [2:2] [N/A:N/A] Wound Status: [1:Open] [2:Open] [N/A:N/A] Measurements L x W x D 3x2x0.2 [2:2.2x1.7x0.2] [N/A:N/A] (cm) Area (cm) : [1:4.712] [2:2.937] [N/A:N/A] Volume (cm) : [1:0.942] [2:0.587] [N/A:N/A] % Reduction in Area: [1:0.00%] [2:25.20%] [N/A:N/A] % Reduction in Volume: 0.00% [  2:25.20%] [N/A:N/A] Classification: [1:Unstageable/Unclassified] [2:Unstageable/Unclassified] [N/A:N/A] Exudate Amount: [1:Large] [2:Medium] [N/A:N/A] Exudate Type: [1:Serosanguineous] [2:Serosanguineous] [N/A:N/A] Exudate Color: [1:red, brown] [2:red, brown] [N/A:N/A] Wound Margin: [1:Distinct, outline attached] [2:Distinct, outline attached] [N/A:N/A] Granulation Amount: [1:None Present (0%)] [2:None Present (0%)] [N/A:N/A] Necrotic Amount: [1:Large (67-100%)] [2:Large (67-100%)] [N/A:N/A] Exposed Structures: [1:Fascia: No Fat: No Tendon: No] [2:Fascia: No Fat: No Tendon: No] [N/A:N/A] Muscle: No Muscle: No Joint: No Joint: No Bone: No Bone: No Limited to Skin Limited to Skin Breakdown Breakdown Epithelialization: None None N/A Periwound Skin Texture: Edema: Yes Edema: Yes N/A Excoriation: No Excoriation: No Induration: No Induration: No Callus:  No Callus: No Crepitus: No Crepitus: No Fluctuance: No Fluctuance: No Friable: No Friable: No Rash: No Rash: No Scarring: No Scarring: No Periwound Skin Maceration: Yes Maceration: Yes N/A Moisture: Moist: Yes Moist: Yes Dry/Scaly: No Dry/Scaly: No Periwound Skin Color: Atrophie Blanche: No Atrophie Blanche: No N/A Cyanosis: No Cyanosis: No Ecchymosis: No Ecchymosis: No Erythema: No Erythema: No Hemosiderin Staining: No Hemosiderin Staining: No Mottled: No Mottled: No Pallor: No Pallor: No Rubor: No Rubor: No Temperature: No Abnormality No Abnormality N/A Tenderness on Yes Yes N/A Palpation: Wound Preparation: Ulcer Cleansing: Ulcer Cleansing: N/A Rinsed/Irrigated with Rinsed/Irrigated with Saline Saline Topical Anesthetic Topical Anesthetic Applied: Other: lidocaine Applied: Other: lidocaine 4% 4% Treatment Notes Electronic Signature(s) Signed: 08/11/2015 1:02:51 PM By: Elpidio EricAfful, Rita BSN, RN Entered By: Elpidio EricAfful, Rita on 08/11/2015 13:02:51 [name], Trenese ([id number]) -------------------------------------------------------------------------------- Multi-Disciplinary Care Plan Details Patient Name: [name], Quantisha 08/11/2015 12:45 Date of Service: PM Medical Record [id number] Number: Patient Account Number: [id number] Date of Birth/Sex: 01/17/1920 (82 y.o. Female) Afful, RN, BSN, Treating RN: [place] [place] Physician: BETH Other Clinician: Odelia GageMCGRANAGHAN, MARY Treating Referring Physician: Lehman PromBritto, Errol BETH Physician/Extender: Tania AdeWeeks in Treatment: 2 Active Inactive Orientation to the Wound Care Program Nursing Diagnoses: Knowledge deficit related to the wound healing center program Goals: Patient/caregiver will verbalize understanding of the Wound Healing Center Program Date Initiated: 07/28/2015 Goal Status: Active Interventions: Provide education on orientation to the wound center Notes: Pressure Nursing Diagnoses: Knowledge  deficit related to causes and risk factors for pressure ulcer development Knowledge deficit related to management of pressures ulcers Potential for impaired tissue integrity related to pressure, friction, moisture, and shear Goals: Patient will remain free from development of additional pressure ulcers Date Initiated: 07/28/2015 Goal Status: Active Patient will remain free of pressure ulcers Date Initiated: 07/28/2015 Goal Status: Active Patient/caregiver will verbalize risk factors for pressure ulcer development Date Initiated: 07/28/2015 Goal Status: Active Patient/caregiver will verbalize understanding of pressure ulcer management Date Initiated: 07/28/2015 [name], Serenity ([id number]) Goal Status: Active Interventions: Assess: immobility, friction, shearing, incontinence upon admission and as needed Assess offloading mechanisms upon admission and as needed Assess potential for pressure ulcer upon admission and as needed Provide education on pressure ulcers Treatment Activities: Patient referred for home evaluation of offloading devices/mattresses : 08/11/2015 Patient referred for pressure reduction/relief devices : 08/11/2015 Patient referred for seating evaluation to ensure proper offloading : 08/11/2015 Pressure reduction/relief device ordered : 08/11/2015 Notes: Wound/Skin Impairment Nursing Diagnoses: Impaired tissue integrity Knowledge deficit related to smoking impact on wound healing Knowledge deficit related to ulceration/compromised skin integrity Goals: Patient/caregiver will verbalize understanding of skin care regimen Date Initiated: 07/28/2015 Goal Status: Active Ulcer/skin breakdown will have a volume reduction of 30% by week 4 Date Initiated: 07/28/2015 Goal Status: Active Ulcer/skin breakdown will have a volume reduction of 50% by week 8 Date Initiated: 07/28/2015 Goal Status: Active Ulcer/skin breakdown will have a volume reduction of 80%  by week 12 Date Initiated:  07/28/2015 Goal Status: Active Ulcer/skin breakdown will heal within 14 weeks Date Initiated: 07/28/2015 Goal Status: Active Interventions: Assess patient/caregiver ability to perform ulcer/skin care regimen upon admission and as needed Assess ulceration(s) every visit Provide education on ulcer and skin care JELESA, MANGINI ([id number]) Treatment Activities: Skin care regimen initiated : 08/11/2015 Topical wound management initiated : 08/11/2015 Notes: Electronic Signature(s) Signed: 08/11/2015 1:02:42 PM By: Elpidio Eric BSN, RN Entered By: Elpidio Eric on 08/11/2015 13:02:41 [name] ([id number]) -------------------------------------------------------------------------------- Pain Assessment Details Patient Name: HALLEE, MCKENNY 08/11/2015 12:45 Date of Service: PM Medical Record [id number] Number: Patient Account Number: [id number] Date of Birth/Sex: [birth year] (82 y.o. Female) Afful, RN, BSN, Treating RN: [place] Sink [facility] [place], [place] Physician: BETH Other Clinician: Odelia Gage Treating Referring Physician: Lehman Prom Physician/Extender: Tania Ade in Treatment: 2 Active Problems Location of Pain Severity and Description of Pain Patient Has Paino No Site Locations Pain Management and Medication Current Pain Management: Electronic Signature(s) Signed: 08/11/2015 12:47:58 PM By: Elpidio Eric BSN, RN Entered By: Elpidio Eric on 08/11/2015 12:47:58 [name] ([id number]) -------------------------------------------------------------------------------- Patient/Caregiver Education Details Patient Name: CAMYA, HAYDON 08/11/2015 12:45 Date of Service: PM Medical Record [id number] Number: Patient Account Number: [id number] Date of Birth/Gender: [birth year] (82 y.o. Female) Afful, RN, BSN, Treating RN: [place] Sink Primary [facility] [place], [place] Physician: BETH Other Clinician: Odelia Gage Treating Referring Physician: Lehman Prom  Physician/Extender: Tania Ade in Treatment: 2 Education Assessment Education Provided To: Patient Education Topics Provided Pressure: Methods: Explain/Verbal Responses: State content correctly Welcome To The Wound Care Center: Methods: Explain/Verbal Responses: State content correctly Wound/Skin Impairment: Methods: Explain/Verbal Responses: State content correctly Electronic Signature(s) Signed: 08/11/2015 3:58:38 PM By: Elpidio Eric BSN, RN Entered By: Elpidio Eric on 08/11/2015 13:19:08 [name] ([id number]) -------------------------------------------------------------------------------- Wound Assessment Details Patient Name: CHEVETTE, FEE 08/11/2015 12:45 Date of Service: PM Medical Record [id number] Number: Patient Account Number: [id number] Date of Birth/Sex: 1920-01-05 (82 y.o. Female) Afful, RN, BSN, Treating RN: [place] Sink [facility] [place], [place] Physician: BETH Other Clinician: Odelia Gage Treating Referring Physician: Evlyn Kanner BETH Physician/Extender: Tania Ade in Treatment: 2 Wound Status Wound Number: 1 Primary Pressure Ulcer Etiology: Wound Location: Left Foot - Lateral Wound Open Wounding Event: Pressure Injury Status: Date Acquired: 05/10/2015 Comorbid Cataracts, Asthma, Arrhythmia, History Weeks Of Treatment: 2 History: of pressure wounds, Osteoarthritis, Clustered Wound: No Dementia Photos Photo Uploaded By: Elpidio Eric on 08/11/2015 15:54:01 Wound Measurements Length: (cm) 3 Width: (cm) 2 Depth: (cm) 0.2 Area: (cm) 4.712 Volume: (cm) 0.942 % Reduction in Area: 0% % Reduction in Volume: 0% Epithelialization: None Tunneling: No Undermining: No Wound Description Classification: Unstageable/Unclassified GIAVANNI, ZEITLIN ([id number]) Foul Odor After Cleansing: No Wound Margin: Distinct, outline attached Exudate Amount: Large Exudate Type: Serosanguineous Exudate Color: red, brown Wound Bed Granulation Amount: None Present  (0%) Exposed Structure Necrotic Amount: Large (67-100%) Fascia Exposed: No Necrotic Quality: Adherent Slough Fat Layer Exposed: No Tendon Exposed: No Muscle Exposed: No Joint Exposed: No Bone Exposed: No Limited to Skin Breakdown Periwound Skin Texture Texture Color No Abnormalities Noted: No No Abnormalities Noted: No Callus: No Atrophie Blanche: No Crepitus: No Cyanosis: No Excoriation: No Ecchymosis: No Fluctuance: No Erythema: No Friable: No Hemosiderin Staining: No Induration: No Mottled: No Localized Edema: Yes Pallor: No Rash: No Rubor: No Scarring: No Temperature / Pain Moisture Temperature: No Abnormality No Abnormalities Noted: No Tenderness on Palpation: Yes Dry / Scaly: No Maceration: Yes Moist: Yes Wound Preparation Ulcer Cleansing: Rinsed/Irrigated with Saline Topical Anesthetic Applied: Other: lidocaine 4%, Treatment Notes Wound #1 (  Left, Lateral Foot) 1. Cleansed with: Clean wound with Normal Saline 3. Peri-wound Care: Skin Prep 4. Dressing Applied: Santyl Ointment 5. Secondary Dressing Applied ADRIANNE, SHACKLETON ([id number]) Bordered Foam Dressing Dry Gauze 6. Footwear/Offloading device applied Other footwear/offloading device applied (specify in notes) Notes sage bootS Electronic Signature(s) Signed: 08/11/2015 3:58:38 PM By: Elpidio Eric BSN, RN Entered By: Elpidio Eric on 08/11/2015 12:57:03 [name] ([id number]) -------------------------------------------------------------------------------- Wound Assessment Details Patient Name: LOTUS, SANTILLO 08/11/2015 12:45 Date of Service: PM Medical Record [id number] Number: Patient Account Number: [id number] Date of Birth/Sex: 1919-07-11 (82 y.o. Female) Afful, RN, BSN, Treating RN: [place] Sink Primary [facility] [place], [place] Physician: BETH Other Clinician: Odelia Gage Treating Referring Physician: Evlyn Kanner BETH Physician/Extender: Tania Ade in Treatment: 2 Wound Status Wound  Number: 2 Primary Pressure Ulcer Etiology: Wound Location: Left Malleolus - Lateral Wound Open Wounding Event: Pressure Injury Status: Date Acquired: 05/10/2015 Comorbid Cataracts, Asthma, Arrhythmia, History Weeks Of Treatment: 2 History: of pressure wounds, Osteoarthritis, Clustered Wound: No Dementia Photos Photo Uploaded By: Elpidio Eric on 08/11/2015 15:54:01 Wound Measurements Length: (cm) 2.2 Width: (cm) 1.7 Depth: (cm) 0.2 Area: (cm) 2.937 Volume: (cm) 0.587 % Reduction in Area: 25.2% % Reduction in Volume: 25.2% Epithelialization: None Tunneling: No Wound Description Classification: Unstageable/Unclassified TERIANN, LIVINGOOD ([id number]) Foul Odor After Cleansing: No Wound Margin: Distinct, outline attached Exudate Amount: Medium Exudate Type: Serosanguineous Exudate Color: red, brown Wound Bed Granulation Amount: None Present (0%) Exposed Structure Necrotic Amount: Large (67-100%) Fascia Exposed: No Necrotic Quality: Adherent Slough Fat Layer Exposed: No Tendon Exposed: No Muscle Exposed: No Joint Exposed: No Bone Exposed: No Limited to Skin Breakdown Periwound Skin Texture Texture Color No Abnormalities Noted: No No Abnormalities Noted: No Callus: No Atrophie Blanche: No Crepitus: No Cyanosis: No Excoriation: No Ecchymosis: No Fluctuance: No Erythema: No Friable: No Hemosiderin Staining: No Induration: No Mottled: No Localized Edema: Yes Pallor: No Rash: No Rubor: No Scarring: No Temperature / Pain Moisture Temperature: No Abnormality No Abnormalities Noted: No Tenderness on Palpation: Yes Dry / Scaly: No Maceration: Yes Moist: Yes Wound Preparation Ulcer Cleansing: Rinsed/Irrigated with Saline Topical Anesthetic Applied: Other: lidocaine 4%, Treatment Notes Wound #2 (Left, Lateral Malleolus) 1. Cleansed with: Clean wound with Normal Saline 3. Peri-wound Care: Skin Prep 4. Dressing Applied: Santyl Ointment 5. Secondary  Dressing Applied FLORENCIA, ZACCARO ([id number]) Bordered Foam Dressing Dry Gauze 6. Footwear/Offloading device applied Other footwear/offloading device applied (specify in notes) Notes sage bootS Electronic Signature(s) Signed: 08/11/2015 3:58:38 PM By: Elpidio Eric BSN, RN Entered By: Elpidio Eric on 08/11/2015 12:57:13 [name] ([id number]) -------------------------------------------------------------------------------- Vitals Details Patient Name: LEILANNY, FLUITT 08/11/2015 12:45 Date of Service: PM Medical Record [id number] Number: Patient Account Number: [id number] Date of Birth/Sex: May 14, 1920 (82 y.o. Female) Afful, RN, BSN, Treating RN: [place] Sink Primary [facility] [place], [place] Physician: BETH Other Clinician: Odelia Gage Treating Referring Physician: Lehman Prom Physician/Extender: Tania Ade in Treatment: 2 Vital Signs Time Taken: 12:57 Temperature (F): 97.6 Height (in): 59 Pulse (bpm): 73 Respiratory Rate (breaths/min): 17 Blood Pressure (mmHg): 131/45 Reference Range: 80 - 120 mg / dl Electronic Signature(s) Signed: 08/11/2015 3:58:38 PM By: Elpidio Eric BSN, RN Entered By: Elpidio Eric on 08/11/2015 13:09:10

## 2015-08-12 NOTE — Progress Notes (Signed)
KEARIE, MENNEN ([id number]) Visit Report for 08/11/2015 Chief Complaint Document Details Patient Name: Madeline Cruz, Madeline Cruz 08/11/2015 12:45 Date of Service: PM Medical Record [id number] Number: Patient Account Number: [id number] Date of Birth/Sex: 14-Nov-1919 (82 y.o. Female) Afful, RN, BSN, Treating RN: [place] [facility] [place], [place] Physician: BETH Other Clinician: Odelia Gage Treating Referring Physician: Lehman Prom Physician/Extender: Tania Ade in Treatment: 2 Information Obtained from: Patient Chief Complaint Patient is at the clinic for treatment of an open pressure ulcer with a arterial competent and this has been there on the left foot since the last 3 months Electronic Signature(s) Signed: 08/11/2015 1:49:54 PM By: Evlyn Kanner MD, FACS Entered By: Evlyn Kanner on 08/11/2015 13:49:54 [name] ([id number]) -------------------------------------------------------------------------------- Debridement Details Patient Name: Madeline Cruz, Madeline Cruz 08/11/2015 12:45 Date of Service: PM Medical Record [id number] Number: Patient Account Number: [id number] Date of Birth/Sex: 08-19-1919 (82 y.o. Female) Afful, RN, BSN, Treating RN: [place] [facility] [place], [place] Physician: BETH Other Clinician: Odelia Gage Treating Referring Physician: Lehman Prom Physician/Extender: Tania Ade in Treatment: 2 Debridement Performed for Wound #1 Left,Lateral Foot Assessment: Performed By: Physician Evlyn Kanner, MD Debridement: Debridement Pre-procedure Yes Verification/Time Out Taken: Start Time: 13:04 Pain Control: Lidocaine 4% Topical Solution Level: Skin/Subcutaneous Tissue Total Area Debrided (L x 3 (cm) x 2 (cm) = 6 (cm) W): Tissue and other Non-Viable, Exudate, Fibrin/Slough, Subcutaneous material debrided: Instrument: Curette Bleeding: Minimum Hemostasis Achieved: Pressure End Time: 13:09 Procedural Pain: 0 Post Procedural Pain: 0 Response to  Treatment: Procedure was tolerated well Post Debridement Measurements of Total Wound Length: (cm) 3 Stage: Category/Stage III Width: (cm) 2 Depth: (cm) 0.2 Volume: (cm) 0.942 Post Procedure Diagnosis Same as Pre-procedure Electronic Signature(s) Signed: 08/11/2015 1:49:42 PM By: Evlyn Kanner MD, FACS Signed: 08/11/2015 3:58:38 PM By: Elpidio Eric BSN, RN Entered By: Evlyn Kanner on 08/11/2015 13:49:42 [name] ([name] ([id number]) -------------------------------------------------------------------------------- Debridement Details Patient Name: Madeline Cruz, Madeline Cruz 08/11/2015 12:45 Date of Service: PM Medical Record [id number] Number: Patient Account Number: [id number] Date of Birth/Sex: [birth year] (82 y.o. Female) Afful, RN, BSN, Treating RN: [place] [facility] [place], [place] Physician: BETH Other Clinician: Odelia Gage Treating Referring Physician: Lehman Prom Physician/Extender: Tania Ade in Treatment: 2 Debridement Performed for Wound #2 Left,Lateral Malleolus Assessment: Performed By: Physician Evlyn Kanner, MD Debridement: Debridement Pre-procedure Yes Verification/Time Out Taken: Start Time: 13:04 Pain Control: Lidocaine 4% Topical Solution Level: Skin/Subcutaneous Tissue Total Area Debrided (L x 2.2 (cm) x 1.7 (cm) = 3.74 (cm) W): Tissue and other Non-Viable, Exudate, Fibrin/Slough, Subcutaneous material debrided: Instrument: Curette Bleeding: Minimum Hemostasis Achieved: Pressure End Time: 13:09 Procedural Pain: 0 Post Procedural Pain: 0 Response to Treatment: Procedure was tolerated well Post Debridement Measurements of Total Wound Length: (cm) 2.2 Stage: Unstageable/Unclassified Width: (cm) 1.7 Depth: (cm) 0.2 Volume: (cm) 0.587 Post Procedure Diagnosis Same as Pre-procedure Electronic Signature(s) Signed: 08/11/2015 1:49:48 PM By: Evlyn Kanner MD, FACS Signed: 08/11/2015 3:58:38 PM By: Elpidio Eric BSN,  RN Entered By: Evlyn Kanner on 08/11/2015 13:49:48 [name] ([name] ([id number]) -------------------------------------------------------------------------------- HPI Details Patient Name: Madeline Cruz, Madeline Cruz 08/11/2015 12:45 Date of Service: PM Medical Record [id number] Number: Patient Account Number: [id number] Date of Birth/Sex: [birth year] (82 y.o. Female) Afful, RN, BSN, Treating RN: [place] [facility] [place], [place] Physician: BETH Other Clinician: Odelia Gage Treating Referring Physician: Lehman Prom Physician/Extender: Tania Ade in Treatment: 2 History of Present Illness Location: left lateral foot Quality: Patient reports experiencing a sharp pain to affected area(s). Severity: Patient states wound are getting worse. Duration: Patient has had the wound for > 3  months prior to seeking treatment at the wound center Timing: Pain in wound is Intermittent (comes and goes Context: The wound appeared gradually over time Modifying Factors: Other treatment(s) tried include:local care and offloading Associated Signs and Symptoms: Patient reports having difficulty standing for long periods. HPI Description: 69-year-old patient who has history of advanced dementia and has been living at a nursing home for a long while. She was a smoker in the past and quit smoking at the age of 6 and has a past medical history of advanced dementia, hyponatremia, urinary tract infection, failure to thrive, macular degeneration and glaucoma. She is also status post appendectomy. 08/04/2015 -- had a left foot x-ray which shows no acute fracture or dislocation. There is no suspicious periostitis or cortical destruction Electronic Signature(s) Signed: 08/11/2015 1:49:58 PM By: Evlyn Kanner MD, FACS Entered By: Evlyn Kanner on 08/11/2015 13:49:58 [name] ([id number]) -------------------------------------------------------------------------------- Physical Exam  Details Patient Name: Madeline Cruz, Madeline Cruz 08/11/2015 12:45 Date of Service: PM Medical Record [id number] Number: Patient Account Number: [id number] Date of Birth/Sex: Nov 06, 1919 (82 y.o. Female) Afful, RN, BSN, Treating RN: [place] [facility] [place], [place] Physician: BETH Other Clinician: Odelia Gage Treating Referring Physician: Lehman Prom Physician/Extender: Tania Ade in Treatment: 2 Constitutional . Pulse regular. Respirations normal and unlabored. Afebrile. . Eyes Nonicteric. Reactive to light. Ears, Nose, Mouth, and Throat Lips, teeth, and gums WNL.Marland Kitchen Moist mucosa without lesions. Neck supple and nontender. No palpable supraclavicular or cervical adenopathy. Normal sized without goiter. Respiratory WNL. No retractions.. Cardiovascular Pedal Pulses WNL. No clubbing, cyanosis or edema. Lymphatic No adneopathy. No adenopathy. No adenopathy. Musculoskeletal Adexa without tenderness or enlargement.. Digits and nails w/o clubbing, cyanosis, infection, petechiae, ischemia, or inflammatory conditions.. Integumentary (Hair, Skin) No suspicious lesions. No crepitus or fluctuance. No peri-wound warmth or erythema. No masses.Marland Kitchen Psychiatric Judgement and insight Intact.. No evidence of depression, anxiety, or agitation.. Notes she did have a significant amount of slough which was sharply debrided with a curette and bleeding controlled with the localize pressure with gauze. Electronic Signature(s) Signed: 08/11/2015 1:50:44 PM By: Evlyn Kanner MD, FACS Entered By: Evlyn Kanner on 08/11/2015 13:50:44 [name] ([id number]) -------------------------------------------------------------------------------- Physician Orders Details Patient Name: Madeline Cruz, Madeline Cruz 08/11/2015 12:45 Date of Service: PM Medical Record [id number] Number: Patient Account Number: [id number] Date of Birth/Sex: 1919/07/02 (82 y.o. Female) Afful, RN, BSN, Treating RN: [place] [facility]  [place], [place] Physician: BETH Other Clinician: Odelia Gage Treating Referring Physician: Lehman Prom Physician/Extender: Tania Ade in Treatment: 2 Verbal / Phone Orders: Yes Clinician: Afful, RN, BSN, Rita Read Back and Verified: Yes Diagnosis Coding Wound Cleansing Wound #1 Left,Lateral Foot o Clean wound with Normal Saline. Wound #2 Left,Lateral Malleolus o Clean wound with Normal Saline. Anesthetic Wound #1 Left,Lateral Foot o Topical Lidocaine 4% cream applied to wound bed prior to debridement Wound #2 Left,Lateral Malleolus o Topical Lidocaine 4% cream applied to wound bed prior to debridement Skin Barriers/Peri-Wound Care Wound #1 Left,Lateral Foot o Skin Prep Wound #2 Left,Lateral Malleolus o Skin Prep Primary Wound Dressing Wound #1 Left,Lateral Foot o Santyl Ointment Wound #2 Left,Lateral Malleolus o Santyl Ointment Secondary Dressing Wound #1 Left,Lateral Foot o Boardered Foam Dressing Wound #2 Left,Lateral Malleolus Racca, Brynnly ([id number]) o Boardered Foam Dressing Dressing Change Frequency Wound #1 Left,Lateral Foot o Change dressing every day. Wound #2 Left,Lateral Malleolus o Change dressing every day. Follow-up Appointments Wound #1 Left,Lateral Foot o Return Appointment in 1 week. Wound #2 Left,Lateral Malleolus o Return Appointment in 1 week. Off-Loading Wound #1 Left,Lateral Foot o Heel suspension  boot to: - SAGE BOOTS Wound #2 Left,Lateral Malleolus o Heel suspension boot to: - SAGE BOOTS Additional Orders / Instructions Wound #1 Left,Lateral Foot o Increase protein intake. o Other: - ZInc oxide. MVI, Vitamin C Wound #2 Left,Lateral Malleolus o Increase protein intake. - ZInc oxide. MVI, Vitamin C Home Health Wound #1 Left,Lateral Foot o Continue Home Health Visits - [facility] o Home Health Nurse may visit PRN to address patientos wound care  needs. o FACE TO FACE ENCOUNTER: MEDICARE and MEDICAID PATIENTS: I certify that this patient is under my care and that I had a face-to-face encounter that meets the physician face-to-face encounter requirements with this patient on this date. The encounter with the patient was in whole or in part for the following MEDICAL CONDITION: (primary reason for Home Healthcare) MEDICAL NECESSITY: I certify, that based on my findings, NURSING services are a medically necessary home health service. HOME BOUND STATUS: I certify that my clinical findings support that this patient is homebound (i.e., Due to illness or injury, pt requires aid of supportive devices such as crutches, cane, wheelchairs, walkers, the use of special transportation or the assistance of another person to leave their place of residence. There is a normal inability to leave the home and doing so requires considerable and taxing effort. Other absences are for medical reasons / religious services and are infrequent or of short duration when for other reasons). Madeline Cruz, Madeline Cruz ([id number]) o If current dressing causes regression in wound condition, may D/C ordered dressing product/s and apply Normal Saline Moist Dressing daily until next Wound Healing Center / Other MD appointment. Notify Wound Healing Center of regression in wound condition at [phone]. o Please direct any NON-WOUND related issues/requests for orders to patient's Primary Care Physician Wound #2 Left,Lateral Malleolus o Continue Home Health Visits - [facility] care o Home Health Nurse may visit PRN to address patientos wound care needs. o FACE TO FACE ENCOUNTER: MEDICARE and MEDICAID PATIENTS: I certify that this patient is under my care and that I had a face-to-face encounter that meets the physician face-to-face encounter requirements with this patient on this date. The encounter with the patient was in whole or in part for  the following MEDICAL CONDITION: (primary reason for Home Healthcare) MEDICAL NECESSITY: I certify, that based on my findings, NURSING services are a medically necessary home health service. HOME BOUND STATUS: I certify that my clinical findings support that this patient is homebound (i.e., Due to illness or injury, pt requires aid of supportive devices such as crutches, cane, wheelchairs, walkers, the use of special transportation or the assistance of another person to leave their place of residence. There is a normal inability to leave the home and doing so requires considerable and taxing effort. Other absences are for medical reasons / religious services and are infrequent or of short duration when for other reasons). o If current dressing causes regression in wound condition, may D/C ordered dressing product/s and apply Normal Saline Moist Dressing daily until next Wound Healing Center / Other MD appointment. Notify Wound Healing Center of regression in wound condition at [phone]. o Please direct any NON-WOUND related issues/requests for orders to patient's Primary Care Physician Medications-please add to medication list. Wound #1 Left,Lateral Foot o Santyl Enzymatic Ointment o Other: - ZINC, ViTAMIN C, MVI Wound #2 Left,Lateral Malleolus o Santyl Enzymatic Ointment o Other: - ZINC, ViTAMIN C, MVI Electronic Signature(s) Signed: 08/11/2015 3:37:46 PM By: Evlyn Kanner MD, FACS  Signed: 08/11/2015 3:58:38 PM By: Elpidio Eric BSN, RN Entered By: Elpidio Eric on 08/11/2015 13:12:01 [name] ([id number]) -------------------------------------------------------------------------------- Prescription 08/11/2015 Patient Name: [name] Physician: Evlyn Kanner MD Date of Birth: 07-26-1919 NPI#: [id number] Sex: F DEA#: [id number] Phone #: [phone] License #: Patient Address: [facility] ATTN [address] [place] [address] [facility] [place], [place] [address] [address], [address] [place], [place] [address] [phone] Allergies sulfa Antibiotics Physician's Orders Santyl Enzymatic Ointment Signature(s): Date(s): Electronic Signature(s) Signed: 08/11/2015 3:37:46 PM By: Evlyn Kanner MD, FACS Signed: 08/11/2015 3:58:38 PM By: Elpidio Eric BSN, RN Entered By: Elpidio Eric on 08/11/2015 13:12:02 [name] ([id number]) --------------------------------------------------------------------------------  Problem List Details Patient Name: Madeline Cruz, Madeline Cruz 08/11/2015 12:45 Date of Service: PM Medical Record [id number] Number: Patient Account Number: [id number] Date of Birth/Sex: December 26, 1919 (82 y.o. Female) Afful, RN, BSN, Treating RN: [place] [facility] [place], [place] Physician: BETH Other Clinician: Odelia Gage Treating Referring Physician: Lehman Prom Physician/Extender: Tania Ade in Treatment: 2 Active Problems ICD-10 Encounter Code Description Active Date Diagnosis L89.523 Pressure ulcer of left ankle, stage 3 07/28/2015 Yes G30.1 Alzheimer's disease with late onset 07/28/2015 Yes Z87.891 Personal history of nicotine dependence 07/28/2015 Yes I70.245 Atherosclerosis of native arteries of left leg with ulceration 07/28/2015 Yes of other part of foot Inactive Problems Resolved Problems Electronic Signature(s) Signed: 08/11/2015 1:49:34 PM By: Evlyn Kanner MD, FACS Entered By: Evlyn Kanner on 08/11/2015 13:49:33 [name] ([id number]) -------------------------------------------------------------------------------- Progress Note Details Patient Name: Madeline Cruz, Madeline Cruz 08/11/2015 12:45 Date of Service: PM Medical Record [id number] Number: Patient Account Number: [id number] Date of Birth/Sex: [birth year] (82 y.o. Female) Afful, RN, BSN, Treating RN:  [facility]ink Primary Care [place], [place] Physician: BETH Other Clinician: Odelia Gage  Treating Referring Physician: Lehman Prom Physician/Extender: Tania Ade in Treatment: 2 Subjective Chief Complaint Information obtained from Patient Patient is at the clinic for treatment of an open pressure ulcer with a arterial competent and this has been there on the left foot since the last 3 months History of Present Illness (HPI) The following HPI elements were documented for the patient's wound: Location: left lateral foot Quality: Patient reports experiencing a sharp pain to affected area(s). Severity: Patient states wound are getting worse. Duration: Patient has had the wound for > 3 months prior to seeking treatment at the wound center Timing: Pain in wound is Intermittent (comes and goes Context: The wound appeared gradually over time Modifying Factors: Other treatment(s) tried include:local care and offloading Associated Signs and Symptoms: Patient reports having difficulty standing for long periods. 37-year-old patient who has history of advanced dementia and has been living at a nursing home for a long while. She was a smoker in the past and quit smoking at the age of 66 and has a past medical history of advanced dementia, hyponatremia, urinary tract infection, failure to thrive, macular degeneration and glaucoma. She is also status post appendectomy. 08/04/2015 -- had a left foot x-ray which shows no acute fracture or dislocation. There is no suspicious periostitis or cortical destruction Objective Constitutional Pulse regular. Respirations normal and unlabored. Afebrile. Vitals Time Taken: 12:57 PM, Height: 59 in, Temperature: 97.6 F, Pulse: 73 bpm, Respiratory Rate: 17 Madeline Cruz, Madeline Cruz ([id number]) breaths/min, Blood Pressure: 131/45 mmHg. Eyes Nonicteric. Reactive to light. Ears, Nose, Mouth, and Throat Lips, teeth, and gums WNL.Marland Kitchen Moist mucosa without lesions. Neck supple and nontender. No palpable supraclavicular or cervical adenopathy. Normal sized without  goiter. Respiratory WNL. No retractions.. Cardiovascular Pedal Pulses WNL. No clubbing, cyanosis  or edema. Lymphatic No adneopathy. No adenopathy. No adenopathy. Musculoskeletal Adexa without tenderness or enlargement.. Digits and nails w/o clubbing, cyanosis, infection, petechiae, ischemia, or inflammatory conditions.Marland Kitchen Psychiatric Judgement and insight Intact.. No evidence of depression, anxiety, or agitation.. General Notes: she did have a significant amount of slough which was sharply debrided with a curette and bleeding controlled with the localize pressure with gauze. Integumentary (Hair, Skin) No suspicious lesions. No crepitus or fluctuance. No peri-wound warmth or erythema. No masses.. Wound #1 status is Open. Original cause of wound was Pressure Injury. The wound is located on the Left,Lateral Foot. The wound measures 3cm length x 2cm width x 0.2cm depth; 4.712cm^2 area and 0.942cm^3 volume. The wound is limited to skin breakdown. There is no tunneling or undermining noted. There is a large amount of serosanguineous drainage noted. The wound margin is distinct with the outline attached to the wound base. There is no granulation within the wound bed. There is a large (67-100%) amount of necrotic tissue within the wound bed including Adherent Slough. The periwound skin appearance exhibited: Localized Edema, Maceration, Moist. The periwound skin appearance did not exhibit: Callus, Crepitus, Excoriation, Fluctuance, Friable, Induration, Rash, Scarring, Dry/Scaly, Atrophie Blanche, Cyanosis, Ecchymosis, Hemosiderin Staining, Mottled, Pallor, Rubor, Erythema. Periwound temperature was noted as No Abnormality. The periwound has tenderness on palpation. Wound #2 status is Open. Original cause of wound was Pressure Injury. The wound is located on the Left,Lateral Malleolus. The wound measures 2.2cm length x 1.7cm width x 0.2cm depth; 2.937cm^2 area and 0.587cm^3 volume. The wound is  limited to skin breakdown. There is no tunneling noted. There is a medium amount of serosanguineous drainage noted. The wound margin is distinct with the outline attached to the wound base. There is no granulation within the wound bed. There is a large (67-100%) amount of Madeline Cruz, BUESCHER ([id number]) necrotic tissue within the wound bed including Adherent Slough. The periwound skin appearance exhibited: Localized Edema, Maceration, Moist. The periwound skin appearance did not exhibit: Callus, Crepitus, Excoriation, Fluctuance, Friable, Induration, Rash, Scarring, Dry/Scaly, Atrophie Blanche, Cyanosis, Ecchymosis, Hemosiderin Staining, Mottled, Pallor, Rubor, Erythema. Periwound temperature was noted as No Abnormality. The periwound has tenderness on palpation. Assessment Active Problems ICD-10 L89.523 - Pressure ulcer of left ankle, stage 3 G30.1 - Alzheimer's disease with late onset Z87.891 - Personal history of nicotine dependence I70.245 - Atherosclerosis of native arteries of left leg with ulceration of other part of foot Procedures Wound #1 Wound #1 is a Pressure Ulcer located on the Left,Lateral Foot . There was a Skin/Subcutaneous Tissue Debridement ([id number]) debridement with total area of 6 sq cm performed by Evlyn Kanner, MD. with the following instrument(s): Curette to remove Non-Viable tissue/material including Exudate, Fibrin/Slough, and Subcutaneous after achieving pain control using Lidocaine 4% Topical Solution. A time out was conducted prior to the start of the procedure. A Minimum amount of bleeding was controlled with Pressure. The procedure was tolerated well with a pain level of 0 throughout and a pain level of 0 following the procedure. Post Debridement Measurements: 3cm length x 2cm width x 0.2cm depth; 0.942cm^3 volume. Post debridement Stage noted as Category/Stage III. Post procedure Diagnosis Wound #1: Same as Pre-Procedure Wound #2 Wound #2 is a Pressure  Ulcer located on the Left,Lateral Malleolus . There was a Skin/Subcutaneous Tissue Debridement ([id number]) debridement with total area of 3.74 sq cm performed by Evlyn Kanner, MD. with the following instrument(s): Curette to remove Non-Viable tissue/material including Exudate, Fibrin/Slough, and Subcutaneous after achieving pain control using Lidocaine 4% Topical  Solution. A time out was conducted prior to the start of the procedure. A Minimum amount of bleeding was controlled with Pressure. The procedure was tolerated well with a pain level of 0 throughout and a pain level of 0 following the procedure. Post Debridement Measurements: 2.2cm length x 1.7cm width x 0.2cm depth; 0.587cm^3 volume. Post debridement Stage noted as Unstageable/Unclassified. Post procedure Diagnosis Wound #2: Same as Pre-Procedure CAELEY, DOHRMANN ([id number]) Plan Wound Cleansing: Wound #1 Left,Lateral Foot: Clean wound with Normal Saline. Wound #2 Left,Lateral Malleolus: Clean wound with Normal Saline. Anesthetic: Wound #1 Left,Lateral Foot: Topical Lidocaine 4% cream applied to wound bed prior to debridement Wound #2 Left,Lateral Malleolus: Topical Lidocaine 4% cream applied to wound bed prior to debridement Skin Barriers/Peri-Wound Care: Wound #1 Left,Lateral Foot: Skin Prep Wound #2 Left,Lateral Malleolus: Skin Prep Primary Wound Dressing: Wound #1 Left,Lateral Foot: Santyl Ointment Wound #2 Left,Lateral Malleolus: Santyl Ointment Secondary Dressing: Wound #1 Left,Lateral Foot: Boardered Foam Dressing Wound #2 Left,Lateral Malleolus: Boardered Foam Dressing Dressing Change Frequency: Wound #1 Left,Lateral Foot: Change dressing every day. Wound #2 Left,Lateral Malleolus: Change dressing every day. Follow-up Appointments: Wound #1 Left,Lateral Foot: Return Appointment in 1 week. Wound #2 Left,Lateral Malleolus: Return Appointment in 1 week. Off-Loading: Wound #1 Left,Lateral Foot: Heel  suspension boot to: - SAGE BOOTS Wound #2 Left,Lateral Malleolus: Heel suspension boot to: - SAGE BOOTS Additional Orders / Instructions: Wound #1 Left,Lateral Foot: Increase protein intake. Other: - ZInc oxide. MVI, Vitamin C Wound #2 Left,Lateral Malleolus: Franca, Gardiner Ramus ([id number]) Increase protein intake. - ZInc oxide. MVI, Vitamin C Home Health: Wound #1 Left,Lateral Foot: Continue Home Health Visits - [facility] Home Health Nurse may visit PRN to address patient s wound care needs. FACE TO FACE ENCOUNTER: MEDICARE and MEDICAID PATIENTS: I certify that this patient is under my care and that I had a face-to-face encounter that meets the physician face-to-face encounter requirements with this patient on this date. The encounter with the patient was in whole or in part for the following MEDICAL CONDITION: (primary reason for Home Healthcare) MEDICAL NECESSITY: I certify, that based on my findings, NURSING services are a medically necessary home health service. HOME BOUND STATUS: I certify that my clinical findings support that this patient is homebound (i.e., Due to illness or injury, pt requires aid of supportive devices such as crutches, cane, wheelchairs, walkers, the use of special transportation or the assistance of another person to leave their place of residence. There is a normal inability to leave the home and doing so requires considerable and taxing effort. Other absences are for medical reasons / religious services and are infrequent or of short duration when for other reasons). If current dressing causes regression in wound condition, may D/C ordered dressing product/s and apply Normal Saline Moist Dressing daily until next Wound Healing Center / Other MD appointment. Notify Wound Healing Center of regression in wound condition at [phone]. Please direct any NON-WOUND related issues/requests for orders to patient's Primary Care  Physician Wound #2 Left,Lateral Malleolus: Continue Home Health Visits - [facility] Health Nurse may visit PRN to address patient s wound care needs. FACE TO FACE ENCOUNTER: MEDICARE and MEDICAID PATIENTS: I certify that this patient is under my care and that I had a face-to-face encounter that meets the physician face-to-face encounter requirements with this patient on this date. The encounter with the patient was in whole or in part for the following MEDICAL CONDITION: (primary reason for Home  Healthcare) MEDICAL NECESSITY: I certify, that based on my findings, NURSING services are a medically necessary home health service. HOME BOUND STATUS: I certify that my clinical findings support that this patient is homebound (i.e., Due to illness or injury, pt requires aid of supportive devices such as crutches, cane, wheelchairs, walkers, the use of special transportation or the assistance of another person to leave their place of residence. There is a normal inability to leave the home and doing so requires considerable and taxing effort. Other absences are for medical reasons / religious services and are infrequent or of short duration when for other reasons). If current dressing causes regression in wound condition, may D/C ordered dressing product/s and apply Normal Saline Moist Dressing daily until next Wound Healing Center / Other MD appointment. Notify Wound Healing Center of regression in wound condition at [phone]. Please direct any NON-WOUND related issues/requests for orders to patient's Primary Care Physician Medications-please add to medication list.: Wound #1 Left,Lateral Foot: Santyl Enzymatic Ointment Other: - ZINC, ViTAMIN C, MVI Wound #2 Left,Lateral Malleolus: Santyl Enzymatic Ointment Other: - ZINC, ViTAMIN C, MVI I have recommended: [name], Redith ([id number]) 1. Santyl ointment locally to be applied to the heel and ankle on a daily  basis. 2. offloading techniques have been discussed in great detail 3. Adequate protein intake and vitamin supplements including vitamin C and zinc Electronic Signature(s) Signed: 08/11/2015 1:51:07 PM By: Evlyn KannerBritto, Kymber Kosar MD, FACS Entered By: Evlyn KannerBritto, Srihari Shellhammer on 08/11/2015 13:51:07 [name], Kathyann ([id number]) -------------------------------------------------------------------------------- SuperBill Details Patient Name: [name], Lizmarie Date of Service: 08/11/2015 Medical Record Patient Account Number: [id number] Number: Afful, RN, BSN, Treating RN: Date of Birth/Sex: 1919/07/03 (82 y.o. Female) [facility] [place], [place] Other Clinician: Physician: BETH Treating Elie Gragert, Threasa AlphaErrol MCGRANA[place]AN, MARY Physician/Extender: Referring Physician: Derald MacleodBETH Weeks in Treatment: 2 Diagnosis Coding ICD-10 Codes Code Description [phone] Pressure ulcer of left ankle, stage 3 G30.1 Alzheimer's disease with late onset Z87.891 Personal history of nicotine dependence I70.245 Atherosclerosis of native arteries of left leg with ulceration of other part of foot Facility Procedures CPT4: Description Modifier Quantity Code [id number] 11042 - DEB SUBQ TISSUE 20 SQ CM/< 1 ICD-10 Description Diagnosis L89.523 Pressure ulcer of left ankle, stage 3 G30.1 Alzheimer's disease with late onset Z87.891 Personal history of nicotine dependence  I70.245 Atherosclerosis of native arteries of left leg with ulceration of other part of foot Physician Procedures CPT4: Description Modifier Quantity Code [id number] 11042 - WC PHYS SUBQ TISS 20 SQ CM 1 ICD-10 Description Diagnosis L89.523 Pressure ulcer of left ankle, stage 3 G30.1 Alzheimer's disease with late onset Z87.891 Personal history of nicotine dependence  I70.245 Atherosclerosis of native arteries of left leg with ulceration of other part of foot Electronic Signature(s) [name], Delania ([id number]) Signed: 08/11/2015 1:51:20 PM By: Evlyn KannerBritto, Rehmat Murtagh MD, FACS Entered By:  Evlyn KannerBritto, Brittinie Wherley on 08/11/2015 13:51:19

## 2015-08-18 ENCOUNTER — Encounter (HOSPITAL_BASED_OUTPATIENT_CLINIC_OR_DEPARTMENT_OTHER): Payer: Medicare Other | Admitting: General Surgery

## 2015-08-18 ENCOUNTER — Encounter: Payer: Self-pay | Admitting: General Surgery

## 2015-08-18 DIAGNOSIS — L89523 Pressure ulcer of left ankle, stage 3: Secondary | ICD-10-CM

## 2015-08-18 DIAGNOSIS — I70245 Atherosclerosis of native arteries of left leg with ulceration of other part of foot: Secondary | ICD-10-CM | POA: Diagnosis not present

## 2015-08-18 NOTE — Progress Notes (Signed)
See iheal note 

## 2015-08-19 NOTE — Progress Notes (Addendum)
Madeline Cruz, Madeline Cruz ([id number]) Visit Report for 08/18/2015 Chief Complaint Document Details Patient Name: Madeline Cruz, Madeline Cruz 08/18/2015 12:45 Date of Service: PM Medical Record [id number] Number: Patient Account Number: [id number] Date of Birth/Sex: Jun 19, 1919 (82 y.o. Female) Afful, Madeline Cruz, Madeline Cruz, Treating Madeline Cruz: Madeline Cruz Primary Care [place], [place] Physician: BETH Other Clinician: Odelia Cruz Treating Referring Physician: Ardath Cruz BETH Physician/Extender: Madeline Cruz in Treatment: 3 Information Obtained from: Patient Chief Complaint Patient is at the clinic for treatment of an open pressure ulcer with a arterial competent and this has been there on the left foot since the last 3 months Electronic Signature(s) Signed: 08/18/2015 1:21:45 PM By: Madeline Sax MD Entered By: Madeline Cruz on 08/18/2015 13:21:45 Madeline Cruz ([id number]) -------------------------------------------------------------------------------- Debridement Details Patient Name: SYMANTHA, STEEBER 08/18/2015 12:45 Date of Service: PM Medical Record [id number] Number: Patient Account Number: [id number] Date of Birth/Sex: May 27, 1920 (82 y.o. Female) Afful, Madeline Cruz, Madeline Cruz, Treating Madeline Cruz: Collins Cruz Primary Care [place], [place] Physician: BETH Other Clinician: Odelia Cruz Treating Referring Physician: Ardath Cruz BETH Physician/Extender: Madeline Cruz in Treatment: 3 Debridement Performed for Wound #1 Left,Lateral Foot Assessment: Performed By: Physician Madeline Sax, MD Debridement: Debridement Pre-procedure Yes Verification/Time Out Taken: Start Time: 13:15 Pain Control: Lidocaine 4% Topical Solution Level: Skin/Subcutaneous Tissue Total Area Debrided (L x 3.4 (cm) x 2 (cm) = 6.8 (cm) W): Tissue and other Non-Viable, Exudate, Fibrin/Slough, Subcutaneous material debrided: Instrument: Curette Bleeding: Minimum Hemostasis Achieved: Pressure End Time: 13:20 Procedural Pain: 0 Post Procedural Pain: 0 Response to  Treatment: Procedure was tolerated well Post Debridement Measurements of Total Wound Length: (cm) 3.4 Stage: Category/Stage III Width: (cm) 2 Depth: (cm) 0.2 Volume: (cm) 1.068 Post Procedure Diagnosis Same as Pre-procedure Electronic Signature(s) Signed: 08/18/2015 3:15:16 PM By: Elpidio Eric BSN, Madeline Cruz Signed: 08/18/2015 4:16:05 PM By: Madeline Sax MD Entered By: Elpidio Eric on 08/18/2015 13:17:20 Madeline Cruz ([id number]) Madeline Cruz ([id number]) -------------------------------------------------------------------------------- HPI Details Patient Name: ZARYA, LASSEIGNE 08/18/2015 12:45 Date of Service: PM Medical Record [id number] Number: Patient Account Number: [id number] Date of Birth/Sex: [birth year] (82 y.o. Female) Afful, Madeline Cruz, Madeline Cruz, Treating Madeline Cruz: [place] Cruz Primary Care [place], [place] Physician: BETH Other Clinician: Odelia Cruz Treating Referring Physician: Ardath Cruz BETH Physician/Extender: Madeline Cruz in Treatment: 3 History of Present Illness Location: left lateral foot Quality: Patient reports experiencing a sharp pain to affected area(s). Severity: Patient states wound are getting worse. Duration: Patient has had the wound for > 3 months prior to seeking treatment at the wound center Timing: Pain in wound is Intermittent (comes and goes Context: The wound appeared gradually over time Modifying Factors: Other treatment(s) tried include:local care and offloading Associated Signs and Symptoms: Patient reports having difficulty standing for long periods. HPI Description: 64-year-old patient who has history of advanced dementia and has been living at a nursing home for a long while. She was a smoker in the past and quit smoking at the age of 49 and has a past medical history of advanced dementia, hyponatremia, urinary tract infection, failure to thrive, macular degeneration and glaucoma. She is also status post appendectomy. 08/04/2015 -- had a left foot x-ray which  shows no acute fracture or dislocation. There is no suspicious periostitis or cortical destruction Electronic Signature(s) Signed: 08/18/2015 1:22:01 PM By: Madeline Sax MD Entered By: Madeline Cruz on 08/18/2015 13:22:01 Madeline Cruz ([id number]) -------------------------------------------------------------------------------- Physical Exam Details Patient Name: AVANGELINA, FLIGHT 08/18/2015 12:45 Date of Service: PM Medical Record [id number] Number: Patient Account Number: [id number] Date of Birth/Sex: Jul 15, 1919 (82 y.o. Female) Afful, Madeline Cruz, Madeline Cruz, Treating Madeline Cruz: Clyman Cruz Primary Care [place], [place] Physician: BETH Other Clinician: Odelia Cruz  Treating Referring Physician: Ardath Cruz [facility] Physician/Extender: Madeline Cruz in Treatment: 3 Electronic Signature(s) Signed: 08/18/2015 1:22:09 PM By: Madeline Sax MD Entered By: Madeline Cruz on 08/18/2015 13:22:09 Madeline Cruz ([id number]) -------------------------------------------------------------------------------- Physician Orders Details Patient Name: VERN, PRESTIA 08/18/2015 12:45 Date of Service: PM Medical Record [id number] Number: Patient Account Number: [id number] Date of Birth/Sex: Feb 28, 1920 (82 y.o. Female) Afful, Madeline Cruz, Madeline Cruz, Treating Madeline Cruz: Galion Cruz Primary Care [place], [place] Physician: BETH Other Clinician: Odelia Cruz Treating Referring Physician: Ardath Cruz BETH Physician/Extender: Madeline Cruz in Treatment: 3 Verbal / Phone Orders: Yes Clinician: Afful, Madeline Cruz, Madeline Cruz, Rita Read Back and Verified: Yes Diagnosis Coding ICD-10 Coding Code Description [phone] Pressure ulcer of left ankle, stage 3 G30.1 Alzheimer's disease with late onset Z87.891 Personal history of nicotine dependence I70.245 Atherosclerosis of native arteries of left leg with ulceration of other part of foot Wound Cleansing Wound #1 Left,Lateral Foot o Clean wound with Normal Saline. Wound #2 Left,Lateral Malleolus o Clean wound with Normal  Saline. Anesthetic Wound #1 Left,Lateral Foot o Topical Lidocaine 4% cream applied to wound bed prior to debridement Wound #2 Left,Lateral Malleolus o Topical Lidocaine 4% cream applied to wound bed prior to debridement Skin Barriers/Peri-Wound Care Wound #1 Left,Lateral Foot o Skin Prep Wound #2 Left,Lateral Malleolus o Skin Prep Primary Wound Dressing Wound #1 Left,Lateral Foot o Santyl Ointment Wound #2 Left,Lateral Malleolus Orth, Kadience ([id number]) o Santyl Ointment Secondary Dressing Wound #1 Left,Lateral Foot o Boardered Foam Dressing Wound #2 Left,Lateral Malleolus o Boardered Foam Dressing Dressing Change Frequency Wound #1 Left,Lateral Foot o Change dressing every day. Wound #2 Left,Lateral Malleolus o Change dressing every day. Follow-up Appointments Wound #1 Left,Lateral Foot o Return Appointment in 1 week. Wound #2 Left,Lateral Malleolus o Return Appointment in 1 week. Off-Loading Wound #1 Left,Lateral Foot o Heel suspension boot to: - SAGE BOOTS Wound #2 Left,Lateral Malleolus o Heel suspension boot to: - SAGE BOOTS Additional Orders / Instructions Wound #1 Left,Lateral Foot o Increase protein intake. o Other: - ZInc oxide. MVI, Vitamin C Wound #2 Left,Lateral Malleolus o Increase protein intake. - ZInc oxide. MVI, Vitamin C Home Health Wound #1 Left,Lateral Foot o Continue Home Health Visits - [facility] o Home Health Nurse may visit PRN to address patientos wound care needs. o FACE TO FACE ENCOUNTER: MEDICARE and MEDICAID PATIENTS: I certify that this patient is under my care and that I had a face-to-face encounter that meets the physician face-to-face encounter requirements with this patient on this date. The encounter with the patient was in whole or in part for the following MEDICAL CONDITION: (primary reason for Home Healthcare) MEDICAL NECESSITY: I certify, that based on  my findings, NURSING services are a medically OLLIE, DELANO ([id number]) necessary home health service. HOME BOUND STATUS: I certify that my clinical findings support that this patient is homebound (i.e., Due to illness or injury, pt requires aid of supportive devices such as crutches, cane, wheelchairs, walkers, the use of special transportation or the assistance of another person to leave their place of residence. There is a normal inability to leave the home and doing so requires considerable and taxing effort. Other absences are for medical reasons / religious services and are infrequent or of short duration when for other reasons). o If current dressing causes regression in wound condition, may D/C ordered dressing product/s and apply Normal Saline Moist Dressing daily until next Wound Healing Center / Other MD appointment. Notify Wound Healing Center of regression in wound condition at [phone]. o Please direct any  NON-WOUND related issues/requests for orders to patient's Primary Care Physician Wound #2 Left,Lateral Malleolus o Continue Home Health Visits - [facility] o Home Health Nurse may visit PRN to address patientos wound care needs. o FACE TO FACE ENCOUNTER: MEDICARE and MEDICAID PATIENTS: I certify that this patient is under my care and that I had a face-to-face encounter that meets the physician face-to-face encounter requirements with this patient on this date. The encounter with the patient was in whole or in part for the following MEDICAL CONDITION: (primary reason for Home Healthcare) MEDICAL NECESSITY: I certify, that based on my findings, NURSING services are a medically necessary home health service. HOME BOUND STATUS: I certify that my clinical findings support that this patient is homebound (i.e., Due to illness or injury, pt requires aid of supportive devices such as crutches, cane, wheelchairs, walkers, the use of  special transportation or the assistance of another person to leave their place of residence. There is a normal inability to leave the home and doing so requires considerable and taxing effort. Other absences are for medical reasons / religious services and are infrequent or of short duration when for other reasons). o If current dressing causes regression in wound condition, may D/C ordered dressing product/s and apply Normal Saline Moist Dressing daily until next Wound Healing Center / Other MD appointment. Notify Wound Healing Center of regression in wound condition at [phone]. o Please direct any NON-WOUND related issues/requests for orders to patient's Primary Care Physician Medications-please add to medication list. Wound #1 Left,Lateral Foot o Santyl Enzymatic Ointment o Other: - ZINC, ViTAMIN C, MVI Wound #2 Left,Lateral Malleolus o Santyl Enzymatic Ointment o Other: - ZINC, ViTAMIN C, MVI Electronic Signature(s) Signed: 08/18/2015 3:15:16 PM By: Elpidio EricAfful, Rita BSN, Madeline Cruz Signed: 08/18/2015 4:16:05 PM By: Madeline SaxParker, Smiley Birr MD Entered By: Elpidio EricAfful, Rita on 08/18/2015 13:17:51 Madeline RoupUCKER, Madeline Cruz ([id number]) Madeline RoupUCKER, Madeline Cruz ([id number]) -------------------------------------------------------------------------------- Prescription 08/18/2015 Patient Name: Madeline RoupUCKER, Madeline Cruz Physician: Madeline SaxPARKER, Zen Felling MD Date of Birth: June 07, 1919 NPI#: [id number] Sex: F DEA#: Phone #: [phone] License #: Patient Address: [facility] [address] [facility] [place] [address] [address], [address] [place], [place] [address] [phone] Allergies sulfa Antibiotics Physician's Orders Santyl Enzymatic Ointment Signature(s): Date(s): Electronic Signature(s) Signed: 08/18/2015 1:26:21 PM By: Madeline SaxParker, Kamalani Mastro MD Entered By: Madeline SaxParker, Nirvaan Frett on 08/18/2015 13:26:21 Madeline RoupUCKER, Madeline Cruz  ([id number]) --------------------------------------------------------------------------------  Problem List Details Patient Name: Madeline RoupUCKER, Annel 08/18/2015 12:45 Date of Service: PM Medical Record [id number] Number: Patient Account Number: [id number] Date of Birth/Sex: June 07, 1919 (82 y.o. Female) Afful, Madeline Cruz, Madeline Cruz, Treating Madeline Cruz: [place] [facility] [place] Physician: BETH Other Clinician: Odelia GageMCGRANAGHAN, MARY Treating Referring Physician: Ardath SaxPARKER, Christy Ehrsam BETH Physician/Extender: Madeline AdeWeeks in Treatment: 3 Active Problems ICD-10 Encounter Code Description Active Date Diagnosis L89.523 Pressure ulcer of left ankle, stage 3 07/28/2015 Yes G30.1 Alzheimer's disease with late onset 07/28/2015 Yes Z87.891 Personal history of nicotine dependence 07/28/2015 Yes I70.245 Atherosclerosis of native arteries of left leg with ulceration 07/28/2015 Yes of other part of foot Inactive Problems Resolved Problems Electronic Signature(s) Signed: 08/18/2015 1:21:14 PM By: Madeline SaxParker, Liberty Stead MD Previous Signature: 08/18/2015 10:41:38 AM Version By: Madeline SaxParker, Julina Altmann MD Entered By: Madeline SaxParker, Zafirah Vanzee on 08/18/2015 13:21:13 Madeline RoupUCKER, Madeline Cruz ([id number]) -------------------------------------------------------------------------------- Progress Note Details Patient Name: Madeline RoupUCKER, Madeline Cruz 08/18/2015 12:45 Date of Service: PM Medical Record [id number] Number: Patient Account Number: [id number] Date of Birth/Sex: June 07, 1919 (82 y.o. Female) Afful, Madeline Cruz, Madeline Cruz, Treating Madeline Cruz: [place] [facility] [place] [facility] Other  Clinician: Odelia Cruz Treating Referring Physician: Ardath Cruz BETH Physician/Extender: Madeline Cruz in Treatment: 3 Subjective Chief Complaint Information obtained from Patient Patient is at the clinic for treatment of an open pressure ulcer with a arterial competent and this has been there on the left foot since the last 3 months History of Present Illness (HPI) The following HPI elements were  documented for the patient's wound: Location: left lateral foot Quality: Patient reports experiencing a sharp pain to affected area(s). Severity: Patient states wound are getting worse. Duration: Patient has had the wound for > 3 months prior to seeking treatment at the wound center Timing: Pain in wound is Intermittent (comes and goes Context: The wound appeared gradually over time Modifying Factors: Other treatment(s) tried include:local care and offloading Associated Signs and Symptoms: Patient reports having difficulty standing for long periods. 4-year-old patient who has history of advanced dementia and has been living at a nursing home for a long while. She was a smoker in the past and quit smoking at the age of 54 and has a past medical history of advanced dementia, hyponatremia, urinary tract infection, failure to thrive, macular degeneration and glaucoma. She is also status post appendectomy. 08/04/2015 -- had a left foot x-ray which shows no acute fracture or dislocation. There is no suspicious periostitis or cortical destruction Objective Constitutional Vitals Time Taken: 1:04 PM, Height: 59 in, Pulse: 68 bpm, Respiratory Rate: 17 breaths/min, Blood Pressure: 118/42 mmHg. JENEANE, PIECZYNSKI ([id number]) Integumentary (Hair, Skin) Wound #1 status is Open. Original cause of wound was Pressure Injury. The wound is located on the Left,Lateral Foot. The wound measures 3.4cm length x 2cm width x 0.2cm depth; 5.341cm^2 area and 1.068cm^3 volume. The wound is limited to skin breakdown. There is no tunneling or undermining noted. There is a large amount of serosanguineous drainage noted. The wound margin is distinct with the outline attached to the wound base. There is no granulation within the wound bed. There is a large (67-100%) amount of necrotic tissue within the wound bed including Adherent Slough. The periwound skin appearance exhibited: Localized Edema, Maceration, Moist. The  periwound skin appearance did not exhibit: Callus, Crepitus, Excoriation, Fluctuance, Friable, Induration, Rash, Scarring, Dry/Scaly, Atrophie Blanche, Cyanosis, Ecchymosis, Hemosiderin Staining, Mottled, Pallor, Rubor, Erythema. Periwound temperature was noted as No Abnormality. The periwound has tenderness on palpation. Wound #2 status is Open. Original cause of wound was Pressure Injury. The wound is located on the Left,Lateral Malleolus. The wound measures 2.2cm length x 2.5cm width x 0.2cm depth; 4.32cm^2 area and 0.864cm^3 volume. The wound is limited to skin breakdown. There is no tunneling or undermining noted. There is a medium amount of serosanguineous drainage noted. The wound margin is distinct with the outline attached to the wound base. There is no granulation within the wound bed. There is a large (67- 100%) amount of necrotic tissue within the wound bed including Adherent Slough. The periwound skin appearance exhibited: Localized Edema, Maceration, Moist. The periwound skin appearance did not exhibit: Callus, Crepitus, Excoriation, Fluctuance, Friable, Induration, Rash, Scarring, Dry/Scaly, Atrophie Blanche, Cyanosis, Ecchymosis, Hemosiderin Staining, Mottled, Pallor, Rubor, Erythema. Periwound temperature was noted as No Abnormality. The periwound has tenderness on palpation. Assessment Active Problems ICD-10 L89.523 - Pressure ulcer of left ankle, stage 3 G30.1 - Alzheimer's disease with late onset Z87.891 - Personal history of nicotine dependence I70.245 - Atherosclerosis of native arteries of left leg with ulceration of other part of foot Patient has stage 3 pressure ulcers lateral left foot. Today debrided necrotic tissue and  dressed with Santyl ointment. She wears offloading boot on both feet. Procedures Wound #1 Wound #1 is a Pressure Ulcer located on the Left,Lateral Foot . There was a Skin/Subcutaneous Tissue Madeline Cruz, Madeline Cruz ([id number]) Debridement [phone])  debridement with total area of 6.8 sq cm performed by Madeline Sax, MD. with the following instrument(s): Curette to remove Non-Viable tissue/material including Exudate, Fibrin/Slough, and Subcutaneous after achieving pain control using Lidocaine 4% Topical Solution. A time out was conducted prior to the start of the procedure. A Minimum amount of bleeding was controlled with Pressure. The procedure was tolerated well with a pain level of 0 throughout and a pain level of 0 following the procedure. Post Debridement Measurements: 3.4cm length x 2cm width x 0.2cm depth; 1.068cm^3 volume. Post debridement Stage noted as Category/Stage III. Post procedure Diagnosis Wound #1: Same as Pre-Procedure Plan Wound Cleansing: Wound #1 Left,Lateral Foot: Clean wound with Normal Saline. Wound #2 Left,Lateral Malleolus: Clean wound with Normal Saline. Anesthetic: Wound #1 Left,Lateral Foot: Topical Lidocaine 4% cream applied to wound bed prior to debridement Wound #2 Left,Lateral Malleolus: Topical Lidocaine 4% cream applied to wound bed prior to debridement Skin Barriers/Peri-Wound Care: Wound #1 Left,Lateral Foot: Skin Prep Wound #2 Left,Lateral Malleolus: Skin Prep Primary Wound Dressing: Wound #1 Left,Lateral Foot: Santyl Ointment Wound #2 Left,Lateral Malleolus: Santyl Ointment Secondary Dressing: Wound #1 Left,Lateral Foot: Boardered Foam Dressing Wound #2 Left,Lateral Malleolus: Boardered Foam Dressing Dressing Change Frequency: Wound #1 Left,Lateral Foot: Change dressing every day. Wound #2 Left,Lateral Malleolus: Change dressing every day. Follow-up Appointments: Wound #1 Left,Lateral Foot: Return Appointment in 1 week. Wound #2 Left,Lateral Malleolus: Madeline Cruz, MCLEES ([id number]) Return Appointment in 1 week. Off-Loading: Wound #1 Left,Lateral Foot: Heel suspension boot to: - SAGE BOOTS Wound #2 Left,Lateral Malleolus: Heel suspension boot to: - SAGE BOOTS Additional  Orders / Instructions: Wound #1 Left,Lateral Foot: Increase protein intake. Other: - ZInc oxide. MVI, Vitamin C Wound #2 Left,Lateral Malleolus: Increase protein intake. - ZInc oxide. MVI, Vitamin C Home Health: Wound #1 Left,Lateral Foot: Continue Home Health Visits - [facility] Home Health Nurse may visit PRN to address patient s wound care needs. FACE TO FACE ENCOUNTER: MEDICARE and MEDICAID PATIENTS: I certify that this patient is under my care and that I had a face-to-face encounter that meets the physician face-to-face encounter requirements with this patient on this date. The encounter with the patient was in whole or in part for the following MEDICAL CONDITION: (primary reason for Home Healthcare) MEDICAL NECESSITY: I certify, that based on my findings, NURSING services are a medically necessary home health service. HOME BOUND STATUS: I certify that my clinical findings support that this patient is homebound (i.e., Due to illness or injury, pt requires aid of supportive devices such as crutches, cane, wheelchairs, walkers, the use of special transportation or the assistance of another person to leave their place of residence. There is a normal inability to leave the home and doing so requires considerable and taxing effort. Other absences are for medical reasons / religious services and are infrequent or of short duration when for other reasons). If current dressing causes regression in wound condition, may D/C ordered dressing product/s and apply Normal Saline Moist Dressing daily until next Wound Healing Center / Other MD appointment. Notify Wound Healing Center of regression in wound condition at [phone]. Please direct any NON-WOUND related issues/requests for orders to patient's Primary Care Physician Wound #2 Left,Lateral Malleolus: [facility] Home  Health Nurse may visit PRN to  address patient s wound care needs. FACE TO FACE ENCOUNTER: MEDICARE and MEDICAID PATIENTS: I certify that this patient is under my care and that I had a face-to-face encounter that meets the physician face-to-face encounter requirements with this patient on this date. The encounter with the patient was in whole or in part for the following MEDICAL CONDITION: (primary reason for Home Healthcare) MEDICAL NECESSITY: I certify, that based on my findings, NURSING services are a medically necessary home health service. HOME BOUND STATUS: I certify that my clinical findings support that this patient is homebound (i.e., Due to illness or injury, pt requires aid of supportive devices such as crutches, cane, wheelchairs, walkers, the use of special transportation or the assistance of another person to leave their place of residence. There is a normal inability to leave the home and doing so requires considerable and taxing effort. Other absences are for medical reasons / religious services and are infrequent or of short duration when for other reasons). If current dressing causes regression in wound condition, may D/C ordered dressing product/s and apply Normal Saline Moist Dressing daily until next Wound Healing Center / Other MD appointment. Notify Wound Healing Center of regression in wound condition at [phone]. Please direct any NON-WOUND related issues/requests for orders to patient's Primary Care Physician Medications-please add to medication list.: Wound #1 Left,Lateral Foot: Santyl Enzymatic Ointment Other: - ZINC, ViTAMIN C, MVI LADIAMOND, GALLINA ([id number]) Wound #2 Left,Lateral Malleolus: Santyl Enzymatic Ointment Other: - ZINC, ViTAMIN C, MVI Electronic Signature(s) Signed: 08/18/2015 1:25:36 PM By: Madeline Sax MD Entered By: Madeline Cruz on 08/18/2015 13:25:36 Madeline Cruz ([id number]) -------------------------------------------------------------------------------- SuperBill  Details Patient Name: Madeline Cruz Date of Service: 08/18/2015 Medical Record Patient Account Number: [id number] Number: Afful, Madeline Cruz, Madeline Cruz, Treating Madeline Cruz: Date of Birth/Sex: June 17, 1919 (82 y.o. Female) [facility] [place], [place] Other Clinician: Physician: BETH Treating Thana Farr, MARY Physician/Extender: Referring Physician: Derald Macleod in Treatment: 3 Diagnosis Coding ICD-10 Codes Code Description [phone] Pressure ulcer of left ankle, stage 3 G30.1 Alzheimer's disease with late onset Z87.891 Personal history of nicotine dependence I70.245 Atherosclerosis of native arteries of left leg with ulceration of other part of foot Facility Procedures CPT4 Code: [id number] Description: 11042 - DEB SUBQ TISSUE 20 SQ CM/< ICD-10 Description Diagnosis L89.523 Pressure ulcer of left ankle, stage 3 Modifier: Quantity: 1 Physician Procedures CPT4: Description Modifier Quantity Code [id number] 10272 - WC PHYS LEVEL 2 - EST PT 1 ICD-10 Description Diagnosis I70.245 Atherosclerosis of native arteries of left leg with ulceration of other part of foot CPT4: [id number] 11042 - WC PHYS SUBQ TISS 20 SQ CM 1 ICD-10 Description Diagnosis L89.523 Pressure ulcer of left ankle, stage 3 Electronic Signature(s) Signed: 08/18/2015 1:26:16 PM By: Madeline Sax MD Entered By: Madeline Cruz on 08/18/2015 13:26:15

## 2015-08-20 NOTE — Progress Notes (Signed)
IYLA, BALZARINI ([id number]) Visit Report for 08/18/2015 Arrival Information Details Patient Name: Madeline Cruz, Madeline Cruz 08/18/2015 12:45 Date of Service: PM Medical Record [id number] Number: Patient Account Number: [id number] Date of Birth/Sex: 1919/09/24 (82 y.o. Female) Afful, RN, BSN, Treating RN: Leroy Sink Primary Care [place], [place] Physician: BETH Other Clinician: Odelia Gage Treating Referring Physician: Ardath Sax BETH Physician/Extender: Tania Ade in Treatment: 3 Visit Information History Since Last Visit Added or deleted any medications: No Patient Arrived: Wheel Chair Any new allergies or adverse reactions: No Arrival Time: 12:59 Had a fall or experienced change in No Accompanied By: dtr activities of daily living that may affect Transfer Assistance: EasyPivot risk of falls: Patient Lift Signs or symptoms of abuse/neglect since last No Patient Identification Verified: Yes visito Secondary Verification Process Yes Hospitalized since last visit: No Completed: Has Dressing in Place as Prescribed: Yes Patient Requires Transmission- No Pain Present Now: No Based Precautions: Patient Has Alerts: No Electronic Signature(s) Signed: 08/18/2015 3:15:16 PM By: Elpidio Eric BSN, RN Entered By: Elpidio Eric on 08/18/2015 13:01:24 Madeline Roup ([id number]) -------------------------------------------------------------------------------- Encounter Discharge Information Details Patient Name: Madeline Cruz, Madeline Cruz 08/18/2015 12:45 Date of Service: PM Medical Record [id number] Number: Patient Account Number: [id number] Date of Birth/Sex: 07-01-1919 (82 y.o. Female) Afful, RN, BSN, Treating RN: [place] Sink Primary [facility] [place], [place] Physician: BETH Other Clinician: Odelia Gage Treating Referring Physician: Ardath Sax BETH Physician/Extender: Tania Ade in Treatment: 3 Encounter Discharge Information Items Schedule Follow-up Appointment: No Medication Reconciliation  completed No and provided to Patient/Care Chelsy Parrales: Provided on Clinical Summary of Care: 08/18/2015 Form Type Recipient Paper Patient LT Electronic Signature(s) Signed: 08/18/2015 1:27:45 PM By: Ardath Sax MD Previous Signature: 08/18/2015 1:26:21 PM Version By: Gwenlyn Perking Entered By: Ardath Sax on 08/18/2015 13:27:45 Madeline Roup ([id number]) -------------------------------------------------------------------------------- Lower Extremity Assessment Details Patient Name: Madeline Cruz, Madeline Cruz 08/18/2015 12:45 Date of Service: PM Medical Record [id number] Number: Patient Account Number: [id number] Date of Birth/Sex: [birth year] (82 y.o. Female) Afful, RN, BSN, Treating RN: [place] Sink [facility] [place], [place] Physician: BETH Other Clinician: Odelia Gage Treating Referring Physician: Ardath Sax BETH Physician/Extender: Tania Ade in Treatment: 3 Vascular Assessment Pulses: Posterior Tibial Dorsalis Pedis Palpable: [Left:Yes] Extremity colors, hair growth, and conditions: Extremity Color: [Left:Mottled] Hair Growth on Extremity: [Left:No] Temperature of Extremity: [Left:Warm] Capillary Refill: [Left:< 3 seconds] Toe Nail Assessment Left: Right: Thick: Yes Discolored: No Deformed: No Improper Length and Hygiene: No Electronic Signature(s) Signed: 08/18/2015 3:15:16 PM By: Elpidio Eric BSN, RN Entered By: Elpidio Eric on 08/18/2015 13:06:29 Madeline Roup ([id number]) -------------------------------------------------------------------------------- Multi Wound Chart Details Patient Name: Madeline Cruz, Madeline Cruz 08/18/2015 12:45 Date of Service: PM Medical Record [id number] Number: Patient Account Number: [id number] Date of Birth/Sex: [birth year] (82 y.o. Female) Afful, RN, BSN, Treating RN: [place] Sink [facility] [place], [place] Physician: BETH Other Clinician: Odelia Gage Treating Referring Physician: Ardath Sax BETH Physician/Extender: Tania Ade in Treatment:  3 Vital Signs Height(in): 59 Pulse(bpm): 68 Weight(lbs): Blood Pressure 118/42 (mmHg): Body Mass Index(BMI): Temperature(F): Respiratory Rate 17 (breaths/min): Photos: [1:No Photos] [2:No Photos] [N/A:N/A] Wound Location: [1:Left Foot - Lateral] [2:Left Malleolus - Lateral] [N/A:N/A] Wounding Event: [1:Pressure Injury] [2:Pressure Injury] [N/A:N/A] Primary Etiology: [1:Pressure Ulcer] [2:Pressure Ulcer] [N/A:N/A] Comorbid History: [1:Cataracts, Asthma, Arrhythmia, History of pressure wounds, Osteoarthritis, Dementia] [2:Cataracts, Asthma, Arrhythmia, History of pressure wounds, Osteoarthritis, Dementia] [N/A:N/A] Date Acquired: [1:05/10/2015] [2:05/10/2015] [N/A:N/A] Weeks of Treatment: [1:3] [2:3] [N/A:N/A] Wound Status: [1:Open] [2:Open] [N/A:N/A] Measurements L x W x D 3.4x2x0.2 [2:2.2x2.5x0.2] [N/A:N/A] (cm) Area (cm) : [1:5.341] [2:4.32] [N/A:N/A] Volume (cm) : [1:1.068] [2:0.864] [N/A:N/A] % Reduction in Area: [1:-13.30%] [2:-10.00%] [N/A:N/A] % Reduction in Volume: -  13.40% [2:-10.10%] [N/A:N/A] Classification: [1:Unstageable/Unclassified] [2:Unstageable/Unclassified] [N/A:N/A] Exudate Amount: [1:Large] [2:Medium] [N/A:N/A] Exudate Type: [1:Serosanguineous] [2:Serosanguineous] [N/A:N/A] Exudate Color: [1:red, brown] [2:red, brown] [N/A:N/A] Wound Margin: [1:Distinct, outline attached] [2:Distinct, outline attached] [N/A:N/A] Granulation Amount: [1:None Present (0%)] [2:None Present (0%)] [N/A:N/A] Necrotic Amount: [1:Large (67-100%)] [2:Large (67-100%)] [N/A:N/A] Exposed Structures: [1:Fascia: No Fat: No Tendon: No] [2:Fascia: No Fat: No Tendon: No] [N/A:N/A] Muscle: No Muscle: No Joint: No Joint: No Bone: No Bone: No Limited to Skin Limited to Skin Breakdown Breakdown Epithelialization: None None N/A Periwound Skin Texture: Edema: Yes Edema: Yes N/A Excoriation: No Excoriation: No Induration: No Induration: No Callus: No Callus: No Crepitus:  No Crepitus: No Fluctuance: No Fluctuance: No Friable: No Friable: No Rash: No Rash: No Scarring: No Scarring: No Periwound Skin Maceration: Yes Maceration: Yes N/A Moisture: Moist: Yes Moist: Yes Dry/Scaly: No Dry/Scaly: No Periwound Skin Color: Atrophie Blanche: No Atrophie Blanche: No N/A Cyanosis: No Cyanosis: No Ecchymosis: No Ecchymosis: No Erythema: No Erythema: No Hemosiderin Staining: No Hemosiderin Staining: No Mottled: No Mottled: No Pallor: No Pallor: No Rubor: No Rubor: No Temperature: No Abnormality No Abnormality N/A Tenderness on Yes Yes N/A Palpation: Wound Preparation: Ulcer Cleansing: Ulcer Cleansing: N/A Rinsed/Irrigated with Rinsed/Irrigated with Saline Saline Topical Anesthetic Topical Anesthetic Applied: Other: lidocaine Applied: Other: lidocaine 4% 4% Treatment Notes Electronic Signature(s) Signed: 08/18/2015 3:15:16 PM By: Elpidio Eric BSN, RN Entered By: Elpidio Eric on 08/18/2015 13:13:35 Madeline Roup ([id number]) -------------------------------------------------------------------------------- Multi-Disciplinary Care Plan Details Patient Name: Madeline Cruz, Madeline Cruz 08/18/2015 12:45 Date of Service: PM Medical Record [id number] Number: Patient Account Number: [id number] Date of Birth/Sex: Jun 30, 1919 (82 y.o. Female) Afful, RN, BSN, Treating RN: [place] Sink Primary [facility] [place], [place] Physician: BETH Other Clinician: Odelia Gage Treating Referring Physician: Ardath Sax BETH Physician/Extender: Tania Ade in Treatment: 3 Active Inactive Orientation to the Wound Care Program Nursing Diagnoses: Knowledge deficit related to the wound healing center program Goals: Patient/caregiver will verbalize understanding of the Wound Healing Center Program Date Initiated: 07/28/2015 Goal Status: Active Interventions: Provide education on orientation to the wound center Notes: Pressure Nursing Diagnoses: Knowledge deficit related to causes  and risk factors for pressure ulcer development Knowledge deficit related to management of pressures ulcers Potential for impaired tissue integrity related to pressure, friction, moisture, and shear Goals: Patient will remain free from development of additional pressure ulcers Date Initiated: 07/28/2015 Goal Status: Active Patient will remain free of pressure ulcers Date Initiated: 07/28/2015 Goal Status: Active Patient/caregiver will verbalize risk factors for pressure ulcer development Date Initiated: 07/28/2015 Goal Status: Active Patient/caregiver will verbalize understanding of pressure ulcer management Date Initiated: 07/28/2015 Madeline Roup ([id number]) Goal Status: Active Interventions: Assess: immobility, friction, shearing, incontinence upon admission and as needed Assess offloading mechanisms upon admission and as needed Assess potential for pressure ulcer upon admission and as needed Provide education on pressure ulcers Treatment Activities: Patient referred for home evaluation of offloading devices/mattresses : 08/18/2015 Patient referred for pressure reduction/relief devices : 08/18/2015 Patient referred for seating evaluation to ensure proper offloading : 08/18/2015 Pressure reduction/relief device ordered : 08/18/2015 Notes: Wound/Skin Impairment Nursing Diagnoses: Impaired tissue integrity Knowledge deficit related to smoking impact on wound healing Knowledge deficit related to ulceration/compromised skin integrity Goals: Patient/caregiver will verbalize understanding of skin care regimen Date Initiated: 07/28/2015 Goal Status: Active Ulcer/skin breakdown will have a volume reduction of 30% by week 4 Date Initiated: 07/28/2015 Goal Status: Active Ulcer/skin breakdown will have a volume reduction of 50% by week 8 Date Initiated: 07/28/2015 Goal Status: Active Ulcer/skin breakdown will have a volume reduction of  80% by week 12 Date Initiated: 07/28/2015 Goal Status:  Active Ulcer/skin breakdown will heal within 14 weeks Date Initiated: 07/28/2015 Goal Status: Active Interventions: Assess patient/caregiver ability to perform ulcer/skin care regimen upon admission and as needed Assess ulceration(s) every visit Provide education on ulcer and skin care Madeline Cruz, Latreshia ([id number]) Treatment Activities: Skin care regimen initiated : 08/18/2015 Topical wound management initiated : 08/18/2015 Notes: Electronic Signature(s) Signed: 08/18/2015 3:15:16 PM By: Elpidio EricAfful, Rita BSN, RN Entered By: Elpidio EricAfful, Rita on 08/18/2015 13:13:20 Madeline Cruz, Dempsey ([id number]) -------------------------------------------------------------------------------- Patient/Caregiver Education Details Patient Name: Madeline Cruz, Perlie 08/18/2015 12:45 Date of Service: PM Medical Record [id number] Number: Patient Account Number: [id number] Date of Birth/Gender: 10-28-1919 (82 y.o. Female) Afful, RN, BSN, Treating RN: [place] [facility] [place] Physician: BETH Other Clinician: Odelia GageMCGRANAGHAN, MARY Treating Referring Physician: Ardath SaxPARKER, PETER BETH Physician/Extender: Tania AdeWeeks in Treatment: 3 Education Assessment Education Provided To: Caregiver Education Topics Provided Pressure: Methods: Explain/Verbal Responses: State content correctly Welcome To The Wound Care Center: Methods: Explain/Verbal Responses: State content correctly Wound/Skin Impairment: Methods: Explain/Verbal Responses: State content correctly Electronic Signature(s) Signed: 08/18/2015 3:15:16 PM By: Elpidio EricAfful, Rita BSN, RN Previous Signature: 08/18/2015 1:27:57 PM Version By: Ardath SaxParker, Peter MD Entered By: Elpidio EricAfful, Rita on 08/18/2015 13:29:29 Madeline Cruz, Shylah ([id number]) -------------------------------------------------------------------------------- Wound Assessment Details Patient Name: Madeline Cruz, Madeline Cruz 08/18/2015 12:45 Date of Service: PM Medical Record [id number] Number: Patient Account Number: [id number] Date of  Birth/Sex: 10-28-1919 (82 y.o. Female) Afful, RN, BSN, Treating RN: [place] [facility]ita [facility] [place] Physician: BETH Other Clinician: Odelia GageMCGRANAGHAN, MARY Treating Referring Physician: Ardath SaxPARKER, PETER BETH Physician/Extender: Tania AdeWeeks in Treatment: 3 Wound Status Wound Number: 1 Primary Pressure Ulcer Etiology: Wound Location: Left Foot - Lateral Wound Open Wounding Event: Pressure Injury Status: Date Acquired: 05/10/2015 Comorbid Cataracts, Asthma, Arrhythmia, History Weeks Of Treatment: 3 History: of pressure wounds, Osteoarthritis, Clustered Wound: No Dementia Photos Photo Uploaded By: Elpidio EricAfful, Rita on 08/18/2015 14:57:29 Wound Measurements Length: (cm) 3.4 Width: (cm) 2 Depth: (cm) 0.2 Area: (cm) 5.341 Volume: (cm) 1.068 % Reduction in Area: -13.3% % Reduction in Volume: -13.4% Epithelialization: None Tunneling: No Undermining: No Wound Description Classification: Unstageable/Unclassified Madeline Cruz, Abbie ([id number]) Foul Odor After Cleansing: No Wound Margin: Distinct, outline attached Exudate Amount: Large Exudate Type: Serosanguineous Exudate Color: red, brown Wound Bed Granulation Amount: None Present (0%) Exposed Structure Necrotic Amount: Large (67-100%) Fascia Exposed: No Necrotic Quality: Adherent Slough Fat Layer Exposed: No Tendon Exposed: No Muscle Exposed: No Joint Exposed: No Bone Exposed: No Limited to Skin Breakdown Periwound Skin Texture Texture Color No Abnormalities Noted: No No Abnormalities Noted: No Callus: No Atrophie Blanche: No Crepitus: No Cyanosis: No Excoriation: No Ecchymosis: No Fluctuance: No Erythema: No Friable: No Hemosiderin Staining: No Induration: No Mottled: No Localized Edema: Yes Pallor: No Rash: No Rubor: No Scarring: No Temperature / Pain Moisture Temperature: No Abnormality No Abnormalities Noted: No Tenderness on Palpation: Yes Dry / Scaly: No Maceration: Yes Moist: Yes Wound Preparation Ulcer  Cleansing: Rinsed/Irrigated with Saline Topical Anesthetic Applied: Other: lidocaine 4%, Treatment Notes Wound #1 (Left, Lateral Foot) 1. Cleansed with: Clean wound with Normal Saline 3. Peri-wound Care: Skin Prep 4. Dressing Applied: Santyl Ointment 5. Secondary Dressing Applied Madeline Cruz, Madeline Cruz ([id number]) Bordered Foam Dressing Dry Gauze 6. Footwear/Offloading device applied Other footwear/offloading device applied (specify in notes) Notes sage bootS Electronic Signature(s) Signed: 08/18/2015 3:15:16 PM By: Elpidio EricAfful, Rita BSN, RN Entered By: Elpidio EricAfful, Rita on 08/18/2015 13:04:00 Madeline Cruz, Madeline Cruz ([id number]) -------------------------------------------------------------------------------- Wound Assessment Details Patient Name: Madeline Cruz, Madeline Cruz 08/18/2015 12:45 Date of Service: PM Medical Record [id number] Number: Patient Account Number:  [id number] Date of Birth/Sex: 03-Feb-1920 (82 y.o. Female) Afful, RN, BSN, Treating RN: [place] Sink Primary Care [place], [place] Physician: BETH Other Clinician: Odelia Gage Treating Referring Physician: Ardath Sax BETH Physician/Extender: Tania Ade in Treatment: 3 Wound Status Wound Number: 2 Primary Pressure Ulcer Etiology: Wound Location: Left Malleolus - Lateral Wound Open Wounding Event: Pressure Injury Status: Date Acquired: 05/10/2015 Comorbid Cataracts, Asthma, Arrhythmia, History Weeks Of Treatment: 3 History: of pressure wounds, Osteoarthritis, Clustered Wound: No Dementia Photos Photo Uploaded By: Elpidio Eric on 08/18/2015 14:57:30 Wound Measurements Length: (cm) 2.2 Width: (cm) 2.5 Depth: (cm) 0.2 Area: (cm) 4.32 Volume: (cm) 0.864 % Reduction in Area: -10% % Reduction in Volume: -10.1% Epithelialization: None Tunneling: No Undermining: No Wound Description Classification: Unstageable/Unclassified SHANECA, ORNE ([id number]) Foul Odor After Cleansing: No Wound Margin: Distinct, outline attached Exudate Amount:  Medium Exudate Type: Serosanguineous Exudate Color: red, brown Wound Bed Granulation Amount: None Present (0%) Exposed Structure Necrotic Amount: Large (67-100%) Fascia Exposed: No Necrotic Quality: Adherent Slough Fat Layer Exposed: No Tendon Exposed: No Muscle Exposed: No Joint Exposed: No Bone Exposed: No Limited to Skin Breakdown Periwound Skin Texture Texture Color No Abnormalities Noted: No No Abnormalities Noted: No Callus: No Atrophie Blanche: No Crepitus: No Cyanosis: No Excoriation: No Ecchymosis: No Fluctuance: No Erythema: No Friable: No Hemosiderin Staining: No Induration: No Mottled: No Localized Edema: Yes Pallor: No Rash: No Rubor: No Scarring: No Temperature / Pain Moisture Temperature: No Abnormality No Abnormalities Noted: No Tenderness on Palpation: Yes Dry / Scaly: No Maceration: Yes Moist: Yes Wound Preparation Ulcer Cleansing: Rinsed/Irrigated with Saline Topical Anesthetic Applied: Other: lidocaine 4%, Treatment Notes Wound #2 (Left, Lateral Malleolus) 1. Cleansed with: Clean wound with Normal Saline 3. Peri-wound Care: Skin Prep 4. Dressing Applied: Santyl Ointment 5. Secondary Dressing Applied VALENA, IVANOV ([id number]) Bordered Foam Dressing Dry Gauze 6. Footwear/Offloading device applied Other footwear/offloading device applied (specify in notes) Notes sage bootS Electronic Signature(s) Signed: 08/18/2015 3:15:16 PM By: Elpidio Eric BSN, RN Entered By: Elpidio Eric on 08/18/2015 13:04:24 Madeline Roup ([id number]) -------------------------------------------------------------------------------- Vitals Details Patient Name: NAYALI, TALERICO 08/18/2015 12:45 Date of Service: PM Medical Record [id number] Number: Patient Account Number: [id number] Date of Birth/Sex: 1920-03-26 (82 y.o. Female) Afful, RN, BSN, Treating RN:  Sink Primary Care [place], [place] Physician: BETH Other Clinician: Odelia Gage  Treating Referring Physician: Ardath Sax BETH Physician/Extender: Tania Ade in Treatment: 3 Vital Signs Time Taken: 13:04 Pulse (bpm): 68 Height (in): 59 Respiratory Rate (breaths/min): 17 Blood Pressure (mmHg): 118/42 Reference Range: 80 - 120 mg / dl Electronic Signature(s) Signed: 08/18/2015 3:15:16 PM By: Elpidio Eric BSN, RN Entered By: Elpidio Eric on 08/18/2015 13:06:57

## 2015-08-25 ENCOUNTER — Encounter: Payer: Medicare Other | Admitting: Surgery

## 2015-08-25 DIAGNOSIS — I70245 Atherosclerosis of native arteries of left leg with ulceration of other part of foot: Secondary | ICD-10-CM | POA: Diagnosis not present

## 2015-08-26 NOTE — Progress Notes (Signed)
Madeline Cruz, Iceis ([id number]) Visit Report for 08/25/2015 Arrival Information Details Patient Name: Madeline Cruz, Madeline Cruz 08/25/2015 12:45 Date of Service: PM Medical Record [id number] Number: Patient Account Number: [id number] Date of Birth/Sex: 08-06-1919 (82 y.o. Female) Afful, RN, BSN, Treating RN: [place] [facility] [place] Physician: BETH Other Clinician: Odelia GageMCGRANAGHAN, [place] Treating Referring Physician: Lehman PromBritto, Errol BETH Physician/Extender: Tania AdeWeeks in Treatment: 4 Visit Information History Since Last Visit Added or deleted any medications: No Patient Arrived: Wheel Chair Any new allergies or adverse reactions: No Arrival Time: 12:53 Had a fall or experienced change in No Accompanied By: dtr activities of daily living that may affect Transfer Assistance: EasyPivot risk of falls: Patient Lift Signs or symptoms of abuse/neglect since last No Patient Identification Verified: Yes visito Secondary Verification Process Yes Has Dressing in Place as Prescribed: Yes Completed: Pain Present Now: No Patient Requires Transmission- No Based Precautions: Patient Has Alerts: No Electronic Signature(s) Signed: 08/25/2015 2:07:24 PM By: Elpidio EricAfful, Rita BSN, RN Entered By: Elpidio EricAfful, Rita on 08/25/2015 12:53:31 Madeline Cruz, Madeline Cruz ([id number]) -------------------------------------------------------------------------------- Encounter Discharge Information Details Patient Name: Madeline Cruz, Madeline Cruz 08/25/2015 12:45 Date of Service: PM Medical Record [id number] Number: Patient Account Number: [id number] Date of Birth/Sex: 08-06-1919 (82 y.o. Female) Afful, RN, BSN, Treating RN: [place] [facility] [place] Physician: BETH Other Clinician: Odelia GageMCGRANAGHAN, [place]ARY Treating Referring Physician: Lehman PromBritto, Errol BETH Physician/Extender: Tania AdeWeeks in Treatment: 4 Encounter Discharge Information Items Discharge Pain Level: 0 Discharge Condition: Stable Ambulatory Status: Wheelchair Discharge Destination:  Nursing Home Transportation: Other Accompanied By: dtr Schedule Follow-up Appointment: No Medication Reconciliation completed and provided to Patient/Care No Marqus Macphee: Provided on Clinical Summary of Care: 08/25/2015 Form Type Recipient Paper Patient LT Electronic Signature(s) Signed: 08/25/2015 1:23:13 PM By: Gwenlyn PerkingMoore, Shelia Entered By: Gwenlyn PerkingMoore, Shelia on 08/25/2015 13:23:13 Madeline Cruz, Maisie ([id number]) -------------------------------------------------------------------------------- Lower Extremity Assessment Details Patient Name: Madeline Cruz, Anaysha 08/25/2015 12:45 Date of Service: PM Medical Record [id number] Number: Patient Account Number: [id number] Date of Birth/Sex: 08-06-1919 (82 y.o. Female) Afful, RN, BSN, Treating RN: [place] [facility] [place] Physician: BETH Other Clinician: Odelia GageMCGRANA[place]AN, [place] Treating Referring Physician: Evlyn KannerBritto, Errol BETH Physician/Extender: Tania AdeWeeks in Treatment: 4 Vascular Assessment Pulses: Posterior Tibial Dorsalis Pedis Palpable: [Left:Yes] Extremity colors, hair growth, and conditions: Extremity Color: [Left:Normal] Hair Growth on Extremity: [Left:No] Temperature of Extremity: [Left:Warm] Capillary Refill: [Left:< 3 seconds] Electronic Signature(s) Signed: 08/25/2015 2:07:24 PM By: Elpidio EricAfful, Rita BSN, RN Entered By: Elpidio EricAfful, Rita on 08/25/2015 12:54:01 Madeline Cruz, Madeline Cruz ([id number]) -------------------------------------------------------------------------------- Multi Wound Chart Details Patient Name: Madeline Cruz, Madeline Cruz 08/25/2015 12:45 Date of Service: PM Medical Record [id number] Number: Patient Account Number: [id number] Date of Birth/Sex: 08-06-1919 (82 y.o. Female) Afful, RN, BSN, Treating RN: [place] [facility] [place] Physician: BETH Other Clinician: Odelia GageMCGRANA[place]AN, [place]ARY Treating Referring Physician: Lehman PromBritto, Errol BETH Physician/Extender: Tania AdeWeeks in Treatment: 4 Vital Signs Height(in): 59 Pulse(bpm): 66 Weight(lbs): Blood  Pressure 117/45 (mmHg): Body Mass Index(BMI): Temperature(F): Respiratory Rate 17 (breaths/min): Photos: [1:No Photos] [2:No Photos] [N/A:N/A] Wound Location: [1:Left Foot - Lateral] [2:Left Malleolus - Lateral] [N/A:N/A] Wounding Event: [1:Pressure Injury] [2:Pressure Injury] [N/A:N/A] Primary Etiology: [1:Pressure Ulcer] [2:Pressure Ulcer] [N/A:N/A] Comorbid History: [1:Cataracts, Asthma, Arrhythmia, History of pressure wounds, Osteoarthritis, Dementia] [2:Cataracts, Asthma, Arrhythmia, History of pressure wounds, Osteoarthritis, Dementia] [N/A:N/A] Date Acquired: [1:05/10/2015] [2:05/10/2015] [N/A:N/A] Weeks of Treatment: [1:4] [2:4] [N/A:N/A] Wound Status: [1:Open] [2:Open] [N/A:N/A] Measurements L x W x D 3x2x0.2 [2:2.2x2x0.2] [N/A:N/A] (cm) Area (cm) : [1:4.712] [2:3.456] [N/A:N/A] Volume (cm) : [1:0.942] [2:0.691] [N/A:N/A] % Reduction in Area: [1:0.00%] [2:12.00%] [N/A:N/A] % Reduction in Volume: 0.00% [2:12.00%] [N/A:N/A] Classification: [1:Unstageable/Unclassified] [2:Unstageable/Unclassified] [N/A:N/A] Exudate Amount: [1:Large] [2:Medium] [N/A:N/A]  Exudate Type: [1:Serosanguineous] [2:Serosanguineous] [N/A:N/A] Exudate Color: [1:red, brown] [2:red, brown] [N/A:N/A] Wound Margin: [1:Distinct, outline attached] [2:Distinct, outline attached] [N/A:N/A] Granulation Amount: [1:None Present (0%)] [2:Small (1-33%)] [N/A:N/A] Necrotic Amount: [1:Large (67-100%)] [2:Large (67-100%)] [N/A:N/A] Exposed Structures: [1:Fascia: No Fat: No Tendon: No] [2:Fascia: No Fat: No Tendon: No] [N/A:N/A] Muscle: No Muscle: No Joint: No Joint: No Bone: No Bone: No Limited to Skin Limited to Skin Breakdown Breakdown Epithelialization: None None N/A Periwound Skin Texture: Edema: Yes Edema: Yes N/A Excoriation: No Excoriation: No Induration: No Induration: No Callus: No Callus: No Crepitus: No Crepitus: No Fluctuance: No Fluctuance: No Friable: No Friable: No Rash: No Rash:  No Scarring: No Scarring: No Periwound Skin Maceration: Yes Maceration: Yes N/A Moisture: Moist: Yes Moist: Yes Dry/Scaly: No Dry/Scaly: No Periwound Skin Color: Atrophie Blanche: No Atrophie Blanche: No N/A Cyanosis: No Cyanosis: No Ecchymosis: No Ecchymosis: No Erythema: No Erythema: No Hemosiderin Staining: No Hemosiderin Staining: No Mottled: No Mottled: No Pallor: No Pallor: No Rubor: No Rubor: No Temperature: No Abnormality No Abnormality N/A Tenderness on Yes Yes N/A Palpation: Wound Preparation: Ulcer Cleansing: Ulcer Cleansing: N/A Rinsed/Irrigated with Rinsed/Irrigated with Saline Saline Topical Anesthetic Topical Anesthetic Applied: Other: lidocaine Applied: Other: lidocaine 4% 4% Treatment Notes Electronic Signature(s) Signed: 08/25/2015 2:07:24 PM By: Elpidio Eric BSN, RN Entered By: Elpidio Eric on 08/25/2015 13:02:02 Madeline Roup ([id number]) -------------------------------------------------------------------------------- Multi-Disciplinary Care Plan Details Patient Name: Madeline Cruz, Madeline Cruz 08/25/2015 12:45 Date of Service: PM Medical Record [id number] Number: Patient Account Number: [id number] Date of Birth/Sex: [birth year] (82 y.o. Female) Afful, RN, BSN, Treating RN: [place] S[facility] [place], [place] Physician: BETH Other Clinician: Odelia Gage Treating Referring Physician: Lehman Prom Physician/Extender: Tania Ade in Treatment: 4 Active Inactive Orientation to the Wound Care Program Nursing Diagnoses: Knowledge deficit related to the wound healing center program Goals: Patient/caregiver will verbalize understanding of the Wound Healing Center Program Date Initiated: 07/28/2015 Goal Status: Active Interventions: Provide education on orientation to the wound center Notes: Pressure Nursing Diagnoses: Knowledge deficit related to causes and risk factors for pressure ulcer development Knowledge deficit related to management of  pressures ulcers Potential for impaired tissue integrity related to pressure, friction, moisture, and shear Goals: Patient will remain free from development of additional pressure ulcers Date Initiated: 07/28/2015 Goal Status: Active Patient will remain free of pressure ulcers Date Initiated: 07/28/2015 Goal Status: Active Patient/caregiver will verbalize risk factors for pressure ulcer development Date Initiated: 07/28/2015 Goal Status: Active Patient/caregiver will verbalize understanding of pressure ulcer management Date Initiated: 07/28/2015 Madeline Roup ([id number]) Goal Status: Active Interventions: Assess: immobility, friction, shearing, incontinence upon admission and as needed Assess offloading mechanisms upon admission and as needed Assess potential for pressure ulcer upon admission and as needed Provide education on pressure ulcers Treatment Activities: Patient referred for home evaluation of offloading devices/mattresses : 08/25/2015 Patient referred for pressure reduction/relief devices : 08/25/2015 Patient referred for seating evaluation to ensure proper offloading : 08/25/2015 Pressure reduction/relief device ordered : 08/25/2015 Notes: Wound/Skin Impairment Nursing Diagnoses: Impaired tissue integrity Knowledge deficit related to smoking impact on wound healing Knowledge deficit related to ulceration/compromised skin integrity Goals: Patient/caregiver will verbalize understanding of skin care regimen Date Initiated: 07/28/2015 Goal Status: Active Ulcer/skin breakdown will have a volume reduction of 30% by week 4 Date Initiated: 07/28/2015 Goal Status: Active Ulcer/skin breakdown will have a volume reduction of 50% by week 8 Date Initiated: 07/28/2015 Goal Status: Active Ulcer/skin breakdown will have a volume reduction of 80% by week 12 Date Initiated: 07/28/2015 Goal Status: Active Ulcer/skin breakdown will  heal within 14 weeks Date Initiated: 07/28/2015 Goal Status:  Active Interventions: Assess patient/caregiver ability to perform ulcer/skin care regimen upon admission and as needed Assess ulceration(s) every visit Provide education on ulcer and skin care Madeline Cruz, Madeline Cruz ([id number]) Treatment Activities: Skin care regimen initiated : 08/25/2015 Topical wound management initiated : 08/25/2015 Notes: Electronic Signature(s) Signed: 08/25/2015 2:07:24 PM By: Elpidio Eric BSN, RN Entered By: Elpidio Eric on 08/25/2015 13:01:55 Madeline Roup ([id number]) -------------------------------------------------------------------------------- Pain Assessment Details Patient Name: Madeline Cruz, Madeline Cruz 08/25/2015 12:45 Date of Service: PM Medical Record [id number] Number: Patient Account Number: [id number] Date of Birth/Sex: Sep 02, 1919 (82 y.o. Female) Afful, RN, BSN, Treating RN: [place] [facility] [place], [place] Physician: BETH Other Clinician: Odelia Gage Treating Referring Physician: Lehman Prom Physician/Extender: Tania Ade in Treatment: 4 Active Problems Location of Pain Severity and Description of Pain Patient Has Paino No Site Locations Pain Management and Medication Current Pain Management: Electronic Signature(s) Signed: 08/25/2015 2:07:24 PM By: Elpidio Eric BSN, RN Entered By: Elpidio Eric on 08/25/2015 12:53:44 Madeline Roup ([id number]) -------------------------------------------------------------------------------- Patient/Caregiver Education Details Patient Name: Madeline Cruz, Madeline Cruz 08/25/2015 12:45 Date of Service: PM Medical Record [id number] Number: Patient Account Number: [id number] Date of Birth/Gender: 06/22/1919 (82 y.o. Female) Afful, RN, BSN, Treating RN: [place] S[facility] [place], [place] Physician: BETH Other Clinician: Odelia Gage Treating Referring Physician: Lehman Prom Physician/Extender: Tania Ade in Treatment: 4 Education Assessment Education Provided To: Caregiver Education Topics  Provided Pressure: Methods: Explain/Verbal Responses: State content correctly Welcome To The Wound Care Center: Methods: Explain/Verbal Responses: State content correctly Wound/Skin Impairment: Methods: Explain/Verbal Responses: State content correctly Electronic Signature(s) Signed: 08/25/2015 2:07:24 PM By: Elpidio Eric BSN, RN Entered By: Elpidio Eric on 08/25/2015 13:21:36 Madeline Roup ([id number]) -------------------------------------------------------------------------------- Wound Assessment Details Patient Name: Madeline Cruz, MULKERN 08/25/2015 12:45 Date of Service: PM Medical Record [id number] Number: Patient Account Number: [id number] Date of Birth/Sex: [birth year] (82 y.o. Female) Afful, RN, BSN, Treating RN: [place] [facility] [place], [place] Physician: BETH Other Clinician: Odelia Gage Treating Referring Physician: Evlyn Kanner BETH Physician/Extender: Tania Ade in Treatment: 4 Wound Status Wound Number: 1 Primary Pressure Ulcer Etiology: Wound Location: Left Foot - Lateral Wound Open Wounding Event: Pressure Injury Status: Date Acquired: 05/10/2015 Comorbid Cataracts, Asthma, Arrhythmia, History Weeks Of Treatment: 4 History: of pressure wounds, Osteoarthritis, Clustered Wound: No Dementia Photos Photo Uploaded By: Elpidio Eric on 08/25/2015 15:39:35 Wound Measurements Length: (cm) 3 Width: (cm) 2 Depth: (cm) 0.2 Area: (cm) 4.712 Volume: (cm) 0.942 % Reduction in Area: 0% % Reduction in Volume: 0% Epithelialization: None Tunneling: No Undermining: No Wound Description Classification: Unstageable/Unclassified JAMIESHA, VICTORIA ([id number]) Foul Odor After Cleansing: No Wound Margin: Distinct, outline attached Exudate Amount: Large Exudate Type: Serosanguineous Exudate Color: red, brown Wound Bed Granulation Amount: None Present (0%) Exposed Structure Necrotic Amount: Large (67-100%) Fascia Exposed: No Necrotic Quality: Adherent Slough Fat  Layer Exposed: No Tendon Exposed: No Muscle Exposed: No Joint Exposed: No Bone Exposed: No Limited to Skin Breakdown Periwound Skin Texture Texture Color No Abnormalities Noted: No No Abnormalities Noted: No Callus: No Atrophie Blanche: No Crepitus: No Cyanosis: No Excoriation: No Ecchymosis: No Fluctuance: No Erythema: No Friable: No Hemosiderin Staining: No Induration: No Mottled: No Localized Edema: Yes Pallor: No Rash: No Rubor: No Scarring: No Temperature / Pain Moisture Temperature: No Abnormality No Abnormalities Noted: No Tenderness on Palpation: Yes Dry / Scaly: No Maceration: Yes Moist: Yes Wound Preparation Ulcer Cleansing: Rinsed/Irrigated with Saline Topical Anesthetic Applied: Other: lidocaine 4%, Treatment Notes Wound #1 (Left, Lateral Foot) 1. Cleansed with: Clean wound with Normal Saline 3.  Peri-wound Care: Skin Prep 4. Dressing Applied: Santyl Ointment 5. Secondary Dressing Applied LAURISSA, COWPER ([id number]) Bordered Foam Dressing Dry Gauze 6. Footwear/Offloading device applied Other footwear/offloading device applied (specify in notes) Notes sage bootS Electronic Signature(s) Signed: 08/25/2015 2:07:24 PM By: Elpidio Eric BSN, RN Entered By: Elpidio Eric on 08/25/2015 13:01:34 Madeline Roup ([id number]) -------------------------------------------------------------------------------- Wound Assessment Details Patient Name: THUY, ATILANO 08/25/2015 12:45 Date of Service: PM Medical Record [id number] Number: Patient Account Number: [id number] Date of Birth/Sex: 1920/05/22 (82 y.o. Female) Afful, RN, BSN, Treating RN: [place] [facility] [place], [place] Physician: BETH Other Clinician: Odelia Gage Treating Referring Physician: Evlyn Kanner BETH Physician/Extender: Tania Ade in Treatment: 4 Wound Status Wound Number: 2 Primary Pressure Ulcer Etiology: Wound Location: Left Malleolus - Lateral Wound Open Wounding Event:  Pressure Injury Status: Date Acquired: 05/10/2015 Comorbid Cataracts, Asthma, Arrhythmia, History Weeks Of Treatment: 4 History: of pressure wounds, Osteoarthritis, Clustered Wound: No Dementia Photos Photo Uploaded By: Elpidio Eric on 08/25/2015 15:39:52 Wound Measurements Length: (cm) 2.2 Width: (cm) 2 Depth: (cm) 0.2 Area: (cm) 3.456 Volume: (cm) 0.691 % Reduction in Area: 12% % Reduction in Volume: 12% Epithelialization: None Tunneling: No Undermining: No Wound Description Classification: Unstageable/Unclassified CARLYNN, LEDUC ([id number]) Foul Odor After Cleansing: No Wound Margin: Distinct, outline attached Exudate Amount: Medium Exudate Type: Serosanguineous Exudate Color: red, brown Wound Bed Granulation Amount: Small (1-33%) Exposed Structure Necrotic Amount: Large (67-100%) Fascia Exposed: No Necrotic Quality: Adherent Slough Fat Layer Exposed: No Tendon Exposed: No Muscle Exposed: No Joint Exposed: No Bone Exposed: No Limited to Skin Breakdown Periwound Skin Texture Texture Color No Abnormalities Noted: No No Abnormalities Noted: No Callus: No Atrophie Blanche: No Crepitus: No Cyanosis: No Excoriation: No Ecchymosis: No Fluctuance: No Erythema: No Friable: No Hemosiderin Staining: No Induration: No Mottled: No Localized Edema: Yes Pallor: No Rash: No Rubor: No Scarring: No Temperature / Pain Moisture Temperature: No Abnormality No Abnormalities Noted: No Tenderness on Palpation: Yes Dry / Scaly: No Maceration: Yes Moist: Yes Wound Preparation Ulcer Cleansing: Rinsed/Irrigated with Saline Topical Anesthetic Applied: Other: lidocaine 4%, Treatment Notes Wound #2 (Left, Lateral Malleolus) 1. Cleansed with: Clean wound with Normal Saline 3. Peri-wound Care: Skin Prep 4. Dressing Applied: Santyl Ointment 5. Secondary Dressing Applied LILYANAH, CELESTIN ([id number]) Bordered Foam Dressing Dry Gauze 6. Footwear/Offloading device  applied Other footwear/offloading device applied (specify in notes) Notes sage bootS Electronic Signature(s) Signed: 08/25/2015 2:07:24 PM By: Elpidio Eric BSN, RN Entered By: Elpidio Eric on 08/25/2015 13:01:51 Madeline Roup ([id number]) -------------------------------------------------------------------------------- Vitals Details Patient Name: DANESSA, MENSCH 08/25/2015 12:45 Date of Service: PM Medical Record [id number] Number: Patient Account Number: [id number] Date of Birth/Sex: Aug 31, 1919 (82 y.o. Female) Afful, RN, BSN, Treating RN: [place] Sink [facility] [place], [place] Physician: BETH Other Clinician: Odelia Gage Treating Referring Physician: Lehman Prom Physician/Extender: Tania Ade in Treatment: 4 Vital Signs Time Taken: 12:56 Pulse (bpm): 66 Height (in): 59 Respiratory Rate (breaths/min): 17 Blood Pressure (mmHg): 117/45 Reference Range: 80 - 120 mg / dl Electronic Signature(s) Signed: 08/25/2015 2:07:24 PM By: Elpidio Eric BSN, RN Entered By: Elpidio Eric on 08/25/2015 12:56:28

## 2015-08-26 NOTE — Progress Notes (Addendum)
Madeline Cruz, Madeline Cruz ([id number]) Visit Report for 08/25/2015 Chief Complaint Document Details Patient Name: Madeline Cruz, Madeline Cruz 08/25/2015 12:45 Date of Service: PM Medical Record [id number] Number: Patient Account Number: [id number] Date of Birth/Sex: 03-20-1920 (82 y.o. Female) Afful, RN, BSN, Treating RN: [place] [facility] [place], [place] Physician: BETH Other Clinician: Odelia Gage Treating Referring Physician: Lehman Prom Physician/Extender: Tania Ade in Treatment: 4 Information Obtained from: Patient Chief Complaint Patient is at the clinic for treatment of an open pressure ulcer with a arterial competent and this has been there on the left foot since the last 3 months Electronic Signature(s) Signed: 08/25/2015 1:22:06 PM By: Evlyn Kanner MD, FACS Entered By: Evlyn Kanner on 08/25/2015 13:22:06 Madeline Cruz ([id number]) -------------------------------------------------------------------------------- Debridement Details Patient Name: Madeline Cruz, Madeline Cruz 08/25/2015 12:45 Date of Service: PM Medical Record [id number] Number: Patient Account Number: [id number] Date of Birth/Sex: March 25, 1920 (82 y.o. Female) Afful, RN, BSN, Treating RN: [place] [facility] [place], [place] Physician: BETH Other Clinician: Odelia Gage Treating Referring Physician: Lehman Prom Physician/Extender: Tania Ade in Treatment: 4 Debridement Performed for Wound #1 Left,Lateral Foot Assessment: Performed By: Physician Evlyn Kanner, MD Debridement: Debridement Pre-procedure Yes Verification/Time Out Taken: Start Time: 13:13 Pain Control: Lidocaine 4% Topical Solution Level: Skin/Subcutaneous Tissue Total Area Debrided (L x 3 (cm) x 2 (cm) = 6 (cm) W): Tissue and other Non-Viable, Exudate, Fibrin/Slough, Subcutaneous material debrided: Instrument: Curette Bleeding: Minimum Hemostasis Achieved: Pressure End Time: 13:16 Procedural Pain: 0 Post Procedural Pain: 0 Response to  Treatment: Procedure was tolerated well Post Debridement Measurements of Total Wound Length: (cm) 3 Stage: Unstageable/Unclassified Width: (cm) 2 Depth: (cm) 0.2 Volume: (cm) 0.942 Post Procedure Diagnosis Same as Pre-procedure Electronic Signature(s) Signed: 08/25/2015 1:21:52 PM By: Evlyn Kanner MD, FACS Signed: 08/25/2015 2:07:24 PM By: Elpidio Eric BSN, RN Entered By: Evlyn Kanner on 08/25/2015 13:21:52 Madeline Cruz ([name] ([id number]) -------------------------------------------------------------------------------- Debridement Details Patient Name: Madeline Cruz, Madeline Cruz 08/25/2015 12:45 Date of Service: PM Medical Record [id number] Number: Patient Account Number: [id number] Date of Birth/Sex: 1920/03/20 (82 y.o. Female) Afful, RN, BSN, Treating RN: [place] [facility] [place], [place] Physician: BETH Other Clinician: Odelia Gage Treating Referring Physician: Lehman Prom Physician/Extender: Tania Ade in Treatment: 4 Debridement Performed for Wound #2 Left,Lateral Malleolus Assessment: Performed By: Physician Evlyn Kanner, MD Debridement: Debridement Pre-procedure Yes Verification/Time Out Taken: Start Time: 13:10 Pain Control: Lidocaine 4% Topical Solution Level: Skin/Subcutaneous Tissue Total Area Debrided (L x 2.2 (cm) x 2 (cm) = 4.4 (cm) W): Tissue and other Non-Viable, Exudate, Fibrin/Slough, Subcutaneous material debrided: Instrument: Curette Bleeding: Minimum Hemostasis Achieved: Pressure End Time: 13:13 Procedural Pain: 0 Post Procedural Pain: 0 Response to Treatment: Procedure was tolerated well Post Debridement Measurements of Total Wound Length: (cm) 2.2 Stage: Category/Stage III Width: (cm) 2 Depth: (cm) 0.2 Volume: (cm) 0.691 Post Procedure Diagnosis Same as Pre-procedure Electronic Signature(s) Signed: 08/25/2015 1:22:00 PM By: Evlyn Kanner MD, FACS Signed: 08/25/2015 2:07:24 PM By: Elpidio Eric BSN, RN Entered  By: Evlyn Kanner on 08/25/2015 13:22:00 Madeline Cruz ([name] ([id number]) -------------------------------------------------------------------------------- HPI Details Patient Name: Madeline Cruz, Madeline Cruz 08/25/2015 12:45 Date of Service: PM Medical Record [id number] Number: Patient Account Number: [id number] Date of Birth/Sex: [birth year] (82 y.o. Female) Afful, RN, BSN, Treating RN: [place] [facility] [place], [place] Physician: BETH Other Clinician: Odelia Gage Treating Referring Physician: Lehman Prom Physician/Extender: Tania Ade in Treatment: 4 History of Present Illness Location: left lateral foot Quality: Patient reports experiencing a sharp pain to affected area(s). Severity: Patient states wound are getting worse. Duration: Patient has had the wound for > 3  months prior to seeking treatment at the wound center Timing: Pain in wound is Intermittent (comes and goes Context: The wound appeared gradually over time Modifying Factors: Other treatment(s) tried include:local care and offloading Associated Signs and Symptoms: Patient reports having difficulty standing for long periods. HPI Description: 82 year old patient who has history of advanced dementia and has been living at a nursing home for a long while. She was a smoker in the past and quit smoking at the age of 20 and has a past medical history of advanced dementia, hyponatremia, urinary tract infection, failure to thrive, macular degeneration and glaucoma. She is also status post appendectomy. 08/04/2015 -- had a left foot x-ray which shows no acute fracture or dislocation. There is no suspicious periostitis or cortical destruction Electronic Signature(s) Signed: 08/25/2015 1:22:14 PM By: Evlyn Kanner MD, FACS Previous Signature: 08/25/2015 12:49:03 PM Version By: Evlyn Kanner MD, FACS Entered By: Evlyn Kanner on 08/25/2015 13:22:14 Madeline Cruz  ([id number]) -------------------------------------------------------------------------------- Physical Exam Details Patient Name: Madeline Cruz, Madeline Cruz 08/25/2015 12:45 Date of Service: PM Medical Record [id number] Number: Patient Account Number: [id number] Date of Birth/Sex: [birth year] (82 y.o. Female) Afful, RN, BSN, Treating RN: [place] [facility] [place], [place] Physician: BETH Other Clinician: Odelia Gage Treating Referring Physician: Lehman Prom Physician/Extender: Tania Ade in Treatment: 4 Constitutional . Pulse regular. Respirations normal and unlabored. Afebrile. . Eyes Nonicteric. Reactive to light. Ears, Nose, Mouth, and Throat Lips, teeth, and gums WNL.Marland Kitchen Moist mucosa without lesions. Neck supple and nontender. No palpable supraclavicular or cervical adenopathy. Normal sized without goiter. Respiratory WNL. No retractions.. Breath sounds WNL, No rubs, rales, rhonchi, or wheeze.. Cardiovascular Heart rhythm and rate regular, no murmur or gallop.. Pedal Pulses WNL. No clubbing, cyanosis or edema. Lymphatic No adneopathy. No adenopathy. No adenopathy. Musculoskeletal Adexa without tenderness or enlargement.. Digits and nails w/o clubbing, cyanosis, infection, petechiae, ischemia, or inflammatory conditions.. Integumentary (Hair, Skin) No suspicious lesions. No crepitus or fluctuance. No peri-wound warmth or erythema. No masses.Marland Kitchen Psychiatric Judgement and insight Intact.. No evidence of depression, anxiety, or agitation.. Notes the patient has some subcutaneous debris over the wounds which were sharply debrided with a curette and bleeding was controlled with pressure. Electronic Signature(s) Signed: 08/25/2015 1:22:44 PM By: Evlyn Kanner MD, FACS Entered By: Evlyn Kanner on 08/25/2015 13:22:43 Madeline Cruz ([id number]) -------------------------------------------------------------------------------- Physician Orders Details Patient Name: Madeline Cruz, Madeline Cruz  08/25/2015 12:45 Date of Service: PM Medical Record [id number] Number: Patient Account Number: [id number] Date of Birth/Sex: September 01, 1919 (82 y.o. Female) Afful, RN, BSN, Treating RN:  [facility] [place], [place] Physician: BETH Other Clinician: Odelia Gage Treating Referring Physician: Lehman Prom Physician/Extender: Tania Ade in Treatment: 4 Verbal / Phone Orders: Yes Clinician: Afful, RN, BSN, Rita Read Back and Verified: Yes Diagnosis Coding Wound Cleansing Wound #1 Left,Lateral Foot o Clean wound with Normal Saline. Wound #2 Left,Lateral Malleolus o Clean wound with Normal Saline. Anesthetic Wound #1 Left,Lateral Foot o Topical Lidocaine 4% cream applied to wound bed prior to debridement Wound #2 Left,Lateral Malleolus o Topical Lidocaine 4% cream applied to wound bed prior to debridement Skin Barriers/Peri-Wound Care Wound #1 Left,Lateral Foot o Skin Prep Wound #2 Left,Lateral Malleolus o Skin Prep Primary Wound Dressing Wound #1 Left,Lateral Foot o Santyl Ointment Wound #2 Left,Lateral Malleolus o Santyl Ointment Secondary Dressing Wound #1 Left,Lateral Foot o Boardered Foam Dressing Wound #2 Left,Lateral Malleolus Leinweber, Levetta ([id number]) o Boardered Foam Dressing Dressing Change Frequency Wound #1 Left,Lateral Foot o Change dressing every day. Wound #2 Left,Lateral Malleolus o Change dressing every day. Follow-up Appointments Wound #  1 Left,Lateral Foot o Return Appointment in 1 week. Wound #2 Left,Lateral Malleolus o Return Appointment in 1 week. Off-Loading Wound #1 Left,Lateral Foot o Heel suspension boot to: - SAGE BOOTS Wound #2 Left,Lateral Malleolus o Heel suspension boot to: - SAGE BOOTS Additional Orders / Instructions Wound #1 Left,Lateral Foot o Increase protein intake. o Other: - ZInc oxide. MVI, Vitamin C Wound #2 Left,Lateral Malleolus o Increase protein intake. - ZInc oxide.  MVI, Vitamin C Home Health Wound #1 Left,Lateral Foot o Continue Home Health Visits - [facility] o Home Health Nurse may visit PRN to address patientos wound care needs. o FACE TO FACE ENCOUNTER: MEDICARE and MEDICAID PATIENTS: I certify that this patient is under my care and that I had a face-to-face encounter that meets the physician face-to-face encounter requirements with this patient on this date. The encounter with the patient was in whole or in part for the following MEDICAL CONDITION: (primary reason for Home Healthcare) MEDICAL NECESSITY: I certify, that based on my findings, NURSING services are a medically necessary home health service. HOME BOUND STATUS: I certify that my clinical findings support that this patient is homebound (i.e., Due to illness or injury, pt requires aid of supportive devices such as crutches, cane, wheelchairs, walkers, the use of special transportation or the assistance of another person to leave their place of residence. There is a normal inability to leave the home and doing so requires considerable and taxing effort. Other absences are for medical reasons / religious services and are infrequent or of short duration when for other reasons). Madeline Cruz, Madeline Cruz ([id number]) o If current dressing causes regression in wound condition, may D/C ordered dressing product/s and apply Normal Saline Moist Dressing daily until next Wound Healing Center / Other MD appointment. Notify Wound Healing Center of regression in wound condition at [phone]. o Please direct any NON-WOUND related issues/requests for orders to patient's Primary Care Physician Wound #2 Left,Lateral Malleolus o Continue Home Health Visits - [facility] o Home Health Nurse may visit PRN to address patientos wound care needs. o FACE TO FACE ENCOUNTER: MEDICARE and MEDICAID PATIENTS: I certify that this patient is under  my care and that I had a face-to-face encounter that meets the physician face-to-face encounter requirements with this patient on this date. The encounter with the patient was in whole or in part for the following MEDICAL CONDITION: (primary reason for Home Healthcare) MEDICAL NECESSITY: I certify, that based on my findings, NURSING services are a medically necessary home health service. HOME BOUND STATUS: I certify that my clinical findings support that this patient is homebound (i.e., Due to illness or injury, pt requires aid of supportive devices such as crutches, cane, wheelchairs, walkers, the use of special transportation or the assistance of another person to leave their place of residence. There is a normal inability to leave the home and doing so requires considerable and taxing effort. Other absences are for medical reasons / religious services and are infrequent or of short duration when for other reasons). o If current dressing causes regression in wound condition, may D/C ordered dressing product/s and apply Normal Saline Moist Dressing daily until next Wound Healing Center / Other MD appointment. Notify Wound Healing Center of regression in wound condition at [phone]. o Please direct any NON-WOUND related issues/requests for orders to patient's Primary Care Physician Medications-please add to medication list. Wound #1 Left,Lateral Foot o Santyl Enzymatic Ointment o Other: - ZINC, ViTAMIN C,  MVI Wound #2 Left,Lateral Malleolus o Santyl Enzymatic Ointment o Other: - ZINC, ViTAMIN C, MVI Electronic Signature(s) Signed: 08/25/2015 2:07:24 PM By: Elpidio Eric BSN, RN Signed: 08/25/2015 4:02:38 PM By: Evlyn Kanner MD, FACS Entered By: Elpidio Eric on 08/25/2015 13:19:26 Madeline Cruz ([id number]) -------------------------------------------------------------------------------- Prescription 08/25/2015 Patient Name: Madeline Cruz Physician: Evlyn Kanner MD Date  of Birth: [birth year] NPI#: [id number] Sex: F DEA#: [id number] Phone #: [phone] License #: Patient Address: [facility] [address] [facility] [place], [place] [address] [address], [address] [place], [place] [address] [phone] Allergies sulfa Antibiotics Physician's Orders Santyl Enzymatic Ointment Signature(s): Date(s): Electronic Signature(s) Signed: 08/25/2015 2:07:24 PM By: Elpidio Eric BSN, RN Signed: 08/25/2015 4:02:38 PM By: Evlyn Kanner MD, FACS Entered By: Elpidio Eric on 08/25/2015 13:19:27 Madeline Cruz ([id number]) --------------------------------------------------------------------------------  Problem List Details Patient Name: Madeline Cruz, Madeline Cruz 08/25/2015 12:45 Date of Service: PM Medical Record [id number] Number: Patient Account Number: [id number] Date of Birth/Sex: 04-Dec-1919 (82 y.o. Female) Afful, RN, BSN, Treating RN: [place] [facility] [place], [place] Physician: BETH Other Clinician: Odelia Gage Treating Referring Physician: Lehman Prom Physician/Extender: Tania Ade in Treatment: 4 Active Problems ICD-10 Encounter Code Description Active Date Diagnosis L89.523 Pressure ulcer of left ankle, stage 3 07/28/2015 Yes G30.1 Alzheimer's disease with late onset 07/28/2015 Yes Z87.891 Personal history of nicotine dependence 07/28/2015 Yes I70.245 Atherosclerosis of native arteries of left leg with ulceration 07/28/2015 Yes of other part of foot Inactive Problems Resolved Problems Electronic Signature(s) Signed: 08/25/2015 1:21:41 PM By: Evlyn Kanner MD, FACS Entered By: Evlyn Kanner on 08/25/2015 13:21:40 Madeline Cruz ([id number]) -------------------------------------------------------------------------------- Progress Note Details Patient Name: Madeline Cruz, Madeline Cruz 08/25/2015 12:45 Date of Service: PM Medical Record [id number] Number: Patient Account  Number: [id number] Date of Birth/Sex: Jan 06, 1920 (82 y.o. Female) Afful, RN, BSN, Treating RN: [place] [facility] [place], [place] Physician: BETH Other Clinician: Odelia Gage Treating Referring Physician: Lehman Prom Physician/Extender: Tania Ade in Treatment: 4 Subjective Chief Complaint Information obtained from Patient Patient is at the clinic for treatment of an open pressure ulcer with a arterial competent and this has been there on the left foot since the last 3 months History of Present Illness (HPI) The following HPI elements were documented for the patient's wound: Location: left lateral foot Quality: Patient reports experiencing a sharp pain to affected area(s). Severity: Patient states wound are getting worse. Duration: Patient has had the wound for > 3 months prior to seeking treatment at the wound center Timing: Pain in wound is Intermittent (comes and goes Context: The wound appeared gradually over time Modifying Factors: Other treatment(s) tried include:local care and offloading Associated Signs and Symptoms: Patient reports having difficulty standing for long periods. 82 year old patient who has history of advanced dementia and has been living at a nursing home for a long while. She was a smoker in the past and quit smoking at the age of 63 and has a past medical history of advanced dementia, hyponatremia, urinary tract infection, failure to thrive, macular degeneration and glaucoma. She is also status post appendectomy. 08/04/2015 -- had a left foot x-ray which shows no acute fracture or dislocation. There is no suspicious periostitis or cortical destruction Objective Constitutional Pulse regular. Respirations normal and unlabored. Afebrile. Vitals Time Taken: 12:56 PM, Height: 59 in, Pulse: 66 bpm, Respiratory Rate: 17 breaths/min, Blood Felber, Theona ([id number]) Pressure: 117/45 mmHg. Eyes Nonicteric. Reactive to light. Ears, Nose, Mouth, and  Throat Lips, teeth, and gums WNL.Marland Kitchen Moist mucosa without lesions. Neck  supple and nontender. No palpable supraclavicular or cervical adenopathy. Normal sized without goiter. Respiratory WNL. No retractions.. Breath sounds WNL, No rubs, rales, rhonchi, or wheeze.. Cardiovascular Heart rhythm and rate regular, no murmur or gallop.. Pedal Pulses WNL. No clubbing, cyanosis or edema. Lymphatic No adneopathy. No adenopathy. No adenopathy. Musculoskeletal Adexa without tenderness or enlargement.. Digits and nails w/o clubbing, cyanosis, infection, petechiae, ischemia, or inflammatory conditions.Marland Kitchen. Psychiatric Judgement and insight Intact.. No evidence of depression, anxiety, or agitation.. General Notes: the patient has some subcutaneous debris over the wounds which were sharply debrided with a curette and bleeding was controlled with pressure. Integumentary (Hair, Skin) No suspicious lesions. No crepitus or fluctuance. No peri-wound warmth or erythema. No masses.. Wound #1 status is Open. Original cause of wound was Pressure Injury. The wound is located on the Left,Lateral Foot. The wound measures 3cm length x 2cm width x 0.2cm depth; 4.712cm^2 area and 0.942cm^3 volume. The wound is limited to skin breakdown. There is no tunneling or undermining noted. There is a large amount of serosanguineous drainage noted. The wound margin is distinct with the outline attached to the wound base. There is no granulation within the wound bed. There is a large (67-100%) amount of necrotic tissue within the wound bed including Adherent Slough. The periwound skin appearance exhibited: Localized Edema, Maceration, Moist. The periwound skin appearance did not exhibit: Callus, Crepitus, Excoriation, Fluctuance, Friable, Induration, Rash, Scarring, Dry/Scaly, Atrophie Blanche, Cyanosis, Ecchymosis, Hemosiderin Staining, Mottled, Pallor, Rubor, Erythema. Periwound temperature was noted as No Abnormality. The periwound  has tenderness on palpation. Wound #2 status is Open. Original cause of wound was Pressure Injury. The wound is located on the Left,Lateral Malleolus. The wound measures 2.2cm length x 2cm width x 0.2cm depth; 3.456cm^2 area and 0.691cm^3 volume. The wound is limited to skin breakdown. There is no tunneling or undermining noted. There is a medium amount of serosanguineous drainage noted. The wound margin is distinct with the outline attached to the wound base. There is small (1-33%) granulation within the wound bed. There is a Madeline RoupUCKER, Emillee ([id number]) large (67-100%) amount of necrotic tissue within the wound bed including Adherent Slough. The periwound skin appearance exhibited: Localized Edema, Maceration, Moist. The periwound skin appearance did not exhibit: Callus, Crepitus, Excoriation, Fluctuance, Friable, Induration, Rash, Scarring, Dry/Scaly, Atrophie Blanche, Cyanosis, Ecchymosis, Hemosiderin Staining, Mottled, Pallor, Rubor, Erythema. Periwound temperature was noted as No Abnormality. The periwound has tenderness on palpation. Assessment Active Problems ICD-10 L89.523 - Pressure ulcer of left ankle, stage 3 G30.1 - Alzheimer's disease with late onset Z87.891 - Personal history of nicotine dependence I70.245 - Atherosclerosis of native arteries of left leg with ulceration of other part of foot Procedures Wound #1 Wound #1 is a Pressure Ulcer located on the Left,Lateral Foot . There was a Skin/Subcutaneous Tissue Debridement ([id number]) debridement with total area of 6 sq cm performed by Evlyn KannerBritto, Rosaria Kubin, MD. with the following instrument(s): Curette to remove Non-Viable tissue/material including Exudate, Fibrin/Slough, and Subcutaneous after achieving pain control using Lidocaine 4% Topical Solution. A time out was conducted prior to the start of the procedure. A Minimum amount of bleeding was controlled with Pressure. The procedure was tolerated well with a pain level of 0  throughout and a pain level of 0 following the procedure. Post Debridement Measurements: 3cm length x 2cm width x 0.2cm depth; 0.942cm^3 volume. Post debridement Stage noted as Unstageable/Unclassified. Post procedure Diagnosis Wound #1: Same as Pre-Procedure Wound #2 Wound #2 is a Pressure Ulcer located on the Left,Lateral Malleolus .  There was a Skin/Subcutaneous Tissue Debridement ([id number]) debridement with total area of 4.4 sq cm performed by Evlyn Kanner, MD. with the following instrument(s): Curette to remove Non-Viable tissue/material including Exudate, Fibrin/Slough, and Subcutaneous after achieving pain control using Lidocaine 4% Topical Solution. A time out was conducted prior to the start of the procedure. A Minimum amount of bleeding was controlled with Pressure. The procedure was tolerated well with a pain level of 0 throughout and a pain level of 0 following the procedure. Post Debridement Measurements: 2.2cm length x 2cm width x 0.2cm depth; 0.691cm^3 volume. Post debridement Stage noted as Category/Stage III. Post procedure Diagnosis Wound #2: Same as Pre-Procedure BRIDIE, COLQUHOUN ([id number]) Plan Wound Cleansing: Wound #1 Left,Lateral Foot: Clean wound with Normal Saline. Wound #2 Left,Lateral Malleolus: Clean wound with Normal Saline. Anesthetic: Wound #1 Left,Lateral Foot: Topical Lidocaine 4% cream applied to wound bed prior to debridement Wound #2 Left,Lateral Malleolus: Topical Lidocaine 4% cream applied to wound bed prior to debridement Skin Barriers/Peri-Wound Care: Wound #1 Left,Lateral Foot: Skin Prep Wound #2 Left,Lateral Malleolus: Skin Prep Primary Wound Dressing: Wound #1 Left,Lateral Foot: Santyl Ointment Wound #2 Left,Lateral Malleolus: Santyl Ointment Secondary Dressing: Wound #1 Left,Lateral Foot: Boardered Foam Dressing Wound #2 Left,Lateral Malleolus: Boardered Foam Dressing Dressing Change Frequency: Wound #1 Left,Lateral  Foot: Change dressing every day. Wound #2 Left,Lateral Malleolus: Change dressing every day. Follow-up Appointments: Wound #1 Left,Lateral Foot: Return Appointment in 1 week. Wound #2 Left,Lateral Malleolus: Return Appointment in 1 week. Off-Loading: Wound #1 Left,Lateral Foot: Heel suspension boot to: - SAGE BOOTS Wound #2 Left,Lateral Malleolus: Heel suspension boot to: - SAGE BOOTS Additional Orders / Instructions: Wound #1 Left,Lateral Foot: Increase protein intake. Other: - ZInc oxide. MVI, Vitamin C Wound #2 Left,Lateral Malleolus: Wanless, Gardiner Ramus ([id number]) Increase protein intake. - ZInc oxide. MVI, Vitamin C Home Health: Wound #1 Left,Lateral Foot: Continue Home Health Visits - [facility] Home Health Nurse may visit PRN to address patient s wound care needs. FACE TO FACE ENCOUNTER: MEDICARE and MEDICAID PATIENTS: I certify that this patient is under my care and that I had a face-to-face encounter that meets the physician face-to-face encounter requirements with this patient on this date. The encounter with the patient was in whole or in part for the following MEDICAL CONDITION: (primary reason for Home Healthcare) MEDICAL NECESSITY: I certify, that based on my findings, NURSING services are a medically necessary home health service. HOME BOUND STATUS: I certify that my clinical findings support that this patient is homebound (i.e., Due to illness or injury, pt requires aid of supportive devices such as crutches, cane, wheelchairs, walkers, the use of special transportation or the assistance of another person to leave their place of residence. There is a normal inability to leave the home and doing so requires considerable and taxing effort. Other absences are for medical reasons / religious services and are infrequent or of short duration when for other reasons). If current dressing causes regression in wound condition, may D/C ordered  dressing product/s and apply Normal Saline Moist Dressing daily until next Wound Healing Center / Other MD appointment. Notify Wound Healing Center of regression in wound condition at [phone]. Please direct any NON-WOUND related issues/requests for orders to patient's Primary Care Physician Wound #2 Left,Lateral Malleolus: Continue Home Health Visits - [facility] Health Nurse may visit PRN to address patient s wound care needs. FACE TO FACE ENCOUNTER: MEDICARE and MEDICAID PATIENTS: I certify that this patient is  under my care and that I had a face-to-face encounter that meets the physician face-to-face encounter requirements with this patient on this date. The encounter with the patient was in whole or in part for the following MEDICAL CONDITION: (primary reason for Home Healthcare) MEDICAL NECESSITY: I certify, that based on my findings, NURSING services are a medically necessary home health service. HOME BOUND STATUS: I certify that my clinical findings support that this patient is homebound (i.e., Due to illness or injury, pt requires aid of supportive devices such as crutches, cane, wheelchairs, walkers, the use of special transportation or the assistance of another person to leave their place of residence. There is a normal inability to leave the home and doing so requires considerable and taxing effort. Other absences are for medical reasons / religious services and are infrequent or of short duration when for other reasons). If current dressing causes regression in wound condition, may D/C ordered dressing product/s and apply Normal Saline Moist Dressing daily until next Wound Healing Center / Other MD appointment. Notify Wound Healing Center of regression in wound condition at [phone]. Please direct any NON-WOUND related issues/requests for orders to patient's Primary Care Physician Medications-please add to medication list.: Wound #1  Left,Lateral Foot: Santyl Enzymatic Ointment Other: - ZINC, ViTAMIN C, MVI Wound #2 Left,Lateral Malleolus: Santyl Enzymatic Ointment Other: - ZINC, ViTAMIN C, MVI I have recommended: JOLEIGH, MINEAU ([id number]) 1. Santyl ointment locally to be applied to the heel and ankle on a daily basis. 2. offloading techniques have been discussed in great detail 3. Adequate protein intake and vitamin supplements including vitamin C and zinc 4. the daughter wanted to know if any specialized bed would help and at the present time I have told her that mainly off loading with position change is going to be beneficial especially for her left foot Electronic Signature(s) Signed: 08/25/2015 1:23:33 PM By: Evlyn Kanner MD, FACS Entered By: Evlyn Kanner on 08/25/2015 13:23:33 Madeline Cruz ([id number]) -------------------------------------------------------------------------------- SuperBill Details Patient Name: Madeline Cruz Date of Service: 08/25/2015 Medical Record Patient Account Number: [id number] Number: Afful, RN, BSN, Treating RN: Date of Birth/Sex: 01-Apr-1920 (82 y.o. Female) [facility], [place] Other Clinician: Physician: BETH Treating Madeline Cruz, Madeline Cruz, MARY Physician/Extender: Referring Physician: Derald Macleod in Treatment: 4 Diagnosis Coding ICD-10 Codes Code Description [phone] Pressure ulcer of left ankle, stage 3 G30.1 Alzheimer's disease with late onset Z87.891 Personal history of nicotine dependence I70.245 Atherosclerosis of native arteries of left leg with ulceration of other part of foot Facility Procedures CPT4: Description Modifier Quantity Code [id number] 11042 - DEB SUBQ TISSUE 20 SQ CM/< 1 ICD-10 Description Diagnosis L89.523 Pressure ulcer of left ankle, stage 3 G30.1 Alzheimer's disease with late onset Z87.891 Personal history of nicotine dependence  I70.245 Atherosclerosis of native arteries of left leg with ulceration of other part of  foot Physician Procedures CPT4: Description Modifier Quantity Code [id number] 11042 - WC PHYS SUBQ TISS 20 SQ CM 1 ICD-10 Description Diagnosis L89.523 Pressure ulcer of left ankle, stage 3 G30.1 Alzheimer's disease with late onset Z87.891 Personal history of nicotine dependence  I70.245 Atherosclerosis of native arteries of left leg with ulceration of other part of foot Electronic Signature(s) HEIDIE, KRALL ([id number]) Signed: 08/25/2015 1:23:44 PM By: Evlyn Kanner MD, FACS Entered By: Evlyn Kanner on 08/25/2015 13:23:44

## 2015-09-01 ENCOUNTER — Encounter: Payer: Medicare Other | Attending: Surgery | Admitting: Surgery

## 2015-09-01 DIAGNOSIS — Z8673 Personal history of transient ischemic attack (TIA), and cerebral infarction without residual deficits: Secondary | ICD-10-CM | POA: Insufficient documentation

## 2015-09-01 DIAGNOSIS — G301 Alzheimer's disease with late onset: Secondary | ICD-10-CM | POA: Diagnosis not present

## 2015-09-01 DIAGNOSIS — I70245 Atherosclerosis of native arteries of left leg with ulceration of other part of foot: Secondary | ICD-10-CM | POA: Diagnosis present

## 2015-09-01 DIAGNOSIS — Z87891 Personal history of nicotine dependence: Secondary | ICD-10-CM | POA: Diagnosis not present

## 2015-09-01 DIAGNOSIS — M1990 Unspecified osteoarthritis, unspecified site: Secondary | ICD-10-CM | POA: Diagnosis not present

## 2015-09-01 DIAGNOSIS — L89523 Pressure ulcer of left ankle, stage 3: Secondary | ICD-10-CM | POA: Insufficient documentation

## 2015-09-01 DIAGNOSIS — R627 Adult failure to thrive: Secondary | ICD-10-CM | POA: Insufficient documentation

## 2015-09-01 DIAGNOSIS — J45909 Unspecified asthma, uncomplicated: Secondary | ICD-10-CM | POA: Diagnosis not present

## 2015-09-01 DIAGNOSIS — H3530 Unspecified macular degeneration: Secondary | ICD-10-CM | POA: Diagnosis not present

## 2015-09-01 DIAGNOSIS — E871 Hypo-osmolality and hyponatremia: Secondary | ICD-10-CM | POA: Diagnosis not present

## 2015-09-02 NOTE — Progress Notes (Signed)
SYDNA, BRODOWSKI ([id number]) Visit Report for 09/01/2015 Arrival Information Details Patient Name: Madeline Cruz, Madeline Cruz Date of Service: 09/01/2015 12:45 PM Medical Record Patient Account Number: [id number] Number: Afful, RN, BSN, Treating RN: Date of Birth/Sex: 15-Apr-1920 (82 y.o. Female) [facility], [place] Other Clinician: Physician: BETH Treating Britto, Threasa Alpha, MARY Physician/Extender: Referring Physician: Derald Macleod in Treatment: 5 Visit Information History Since Last Visit Added or deleted any medications: No Patient Arrived: Wheel Chair Any new allergies or adverse reactions: No Arrival Time: 12:55 Had a fall or experienced change in No activities of daily living that may affect Accompanied By: dtr risk of falls: Transfer Assistance: None Signs or symptoms of abuse/neglect since last No Patient Identification Verified: Yes visito Secondary Verification Process Yes Hospitalized since last visit: No Completed: Has Dressing in Place as Prescribed: Yes Patient Requires Transmission-Based No Pain Present Now: No Precautions: Patient Has Alerts: No Electronic Signature(s) Signed: 09/01/2015 4:31:50 PM By: Elpidio Eric BSN, RN Entered By: Elpidio Eric on 09/01/2015 12:56:14 Madeline Cruz ([id number]) -------------------------------------------------------------------------------- Encounter Discharge Information Details Patient Name: Madeline Cruz Date of Service: 09/01/2015 12:45 PM Medical Record Patient Account Number: [id number] Number: Afful, RN, BSN, Treating RN: Date of Birth/Sex: 12-02-1919 (82 y.o. Female) [facility], [place] Other Clinician: Physician: BETH Treating Britto, Threasa Alpha, MARY Physician/Extender: Referring Physician: Derald Macleod in Treatment: 5 Encounter Discharge Information Items Discharge Pain Level: 0 Discharge Condition: Stable Ambulatory Status: Wheelchair Discharge  Destination: Nursing Home Transportation: Other Accompanied By: dtr Schedule Follow-up Appointment: No Medication Reconciliation completed No and provided to Patient/Care Delos Klich: Provided on Clinical Summary of Care: 09/01/2015 Form Type Recipient Paper Patient LT Electronic Signature(s) Signed: 09/01/2015 1:26:42 PM By: Gwenlyn Perking Entered By: Gwenlyn Perking on 09/01/2015 13:26:42 Madeline Cruz ([id number]) -------------------------------------------------------------------------------- Lower Extremity Assessment Details Patient Name: Madeline Cruz Date of Service: 09/01/2015 12:45 PM Medical Record Patient Account Number: [id number] Number: Afful, RN, BSN, Treating RN: Date of Birth/Sex: 08-26-1919 (82 y.o. Female) [facility], [place] Other Clinician: Physician: BETH Treating Britto, Threasa Alpha, MARY Physician/Extender: Referring Physician: Derald Macleod in Treatment: 5 Vascular Assessment Pulses: Posterior Tibial Dorsalis Pedis Palpable: [Left:Yes] Extremity colors, hair growth, and conditions: Extremity Color: [Left:Mottled] Hair Growth on Extremity: [Left:No] Temperature of Extremity: [Left:Warm] Capillary Refill: [Left:< 3 seconds] Toe Nail Assessment Left: Right: Thick: Yes Discolored: Yes Deformed: No Improper Length and Hygiene: No Electronic Signature(s) Signed: 09/01/2015 4:31:50 PM By: Elpidio Eric BSN, RN Entered By: Elpidio Eric on 09/01/2015 12:56:40 Madeline Cruz ([id number]) -------------------------------------------------------------------------------- Multi Wound Chart Details Patient Name: Madeline Cruz Date of Service: 09/01/2015 12:45 PM Medical Record Patient Account Number: [id number] Number: Afful, RN, BSN, Treating RN: Date of Birth/Sex: 1919/09/15 (82 y.o. Female) [facility], [place] Other Clinician: Physician: BETH Treating Britto, Threasa Alpha, MARY  Physician/Extender: Referring Physician: Derald Macleod in Treatment: 5 Vital Signs Height(in): 59 Pulse(bpm): 67 Weight(lbs): Blood Pressure 136/66 (mmHg): Body Mass Index(BMI): Temperature(F): 96.4 Respiratory Rate 16 (breaths/min): Photos: [1:No Photos] [2:No Photos] [N/A:N/A] Wound Location: [1:Left Foot - Lateral] [2:Left Malleolus - Lateral] [N/A:N/A] Wounding Event: [1:Pressure Injury] [2:Pressure Injury] [N/A:N/A] Primary Etiology: [1:Pressure Ulcer] [2:Pressure Ulcer] [N/A:N/A] Comorbid History: [1:Cataracts, Asthma, Arrhythmia, History of pressure wounds, Osteoarthritis, Dementia] [2:Cataracts, Asthma, Arrhythmia, History of pressure wounds, Osteoarthritis, Dementia] [N/A:N/A] Date Acquired: [1:05/10/2015] [2:05/10/2015] [N/A:N/A] Weeks of Treatment: [1:5] [2:5] [N/A:N/A] Wound Status: [1:Open] [2:Open] [N/A:N/A] Measurements L x W x D 3.3x1.7x0.2 [2:2.3x1.5x0.2] [N/A:N/A] (cm) Area (cm) : [1:4.406] [2:2.71] [N/A:N/A] Volume (cm) : [1:0.881] [2:0.542] [N/A:N/A] % Reduction in Area: [  1:6.50%] [2:31.00%] [N/A:N/A] % Reduction in Volume: 6.50% [2:31.00%] [N/A:N/A] Classification: [1:Unstageable/Unclassified] [2:Unstageable/Unclassified] [N/A:N/A] Exudate Amount: [1:Large] [2:Medium] [N/A:N/A] Exudate Type: [1:Serosanguineous] [2:Serosanguineous] [N/A:N/A] Exudate Color: [1:red, brown] [2:red, brown] [N/A:N/A] Wound Margin: [1:Distinct, outline attached] [2:Distinct, outline attached] [N/A:N/A] Granulation Amount: [1:Small (1-33%)] [2:Small (1-33%)] [N/A:N/A] Granulation Quality: [1:Pink, Pale] [2:Red, Pink] [N/A:N/A] Necrotic Amount: [1:Large (67-100%)] [2:Large (67-100%)] [N/A:N/A] Exposed Structures: [1:Fascia: No Fat: No] [2:Fascia: No Fat: No] [N/A:N/A] Tendon: No Tendon: No Muscle: No Muscle: No Joint: No Joint: No Bone: No Bone: No Limited to Skin Limited to Skin Breakdown Breakdown Epithelialization: None None N/A Periwound Skin Texture: Edema: Yes  Edema: Yes N/A Excoriation: No Excoriation: No Induration: No Induration: No Callus: No Callus: No Crepitus: No Crepitus: No Fluctuance: No Fluctuance: No Friable: No Friable: No Rash: No Rash: No Scarring: No Scarring: No Periwound Skin Maceration: Yes Maceration: Yes N/A Moisture: Moist: Yes Moist: Yes Dry/Scaly: No Dry/Scaly: No Periwound Skin Color: Atrophie Blanche: No Atrophie Blanche: No N/A Cyanosis: No Cyanosis: No Ecchymosis: No Ecchymosis: No Erythema: No Erythema: No Hemosiderin Staining: No Hemosiderin Staining: No Mottled: No Mottled: No Pallor: No Pallor: No Rubor: No Rubor: No Temperature: No Abnormality No Abnormality N/A Tenderness on Yes Yes N/A Palpation: Wound Preparation: Ulcer Cleansing: Ulcer Cleansing: N/A Rinsed/Irrigated with Rinsed/Irrigated with Saline Saline Topical Anesthetic Topical Anesthetic Applied: Other: lidocaine Applied: Other: lidocaine 4% 4% Treatment Notes Electronic Signature(s) Signed: 09/01/2015 4:31:50 PM By: Elpidio Eric BSN, RN Entered By: Elpidio Eric on 09/01/2015 13:16:46 Madeline Cruz ([id number]) -------------------------------------------------------------------------------- Multi-Disciplinary Care Plan Details Patient Name: Madeline Cruz Date of Service: 09/01/2015 12:45 PM Medical Record Patient Account Number: [id number] Number: Afful, RN, BSN, Treating RN: Date of Birth/Sex: 03-25-1920 (82 y.o. Female) [facility], [place] Other Clinician: Physician: BETH Treating Britto, Threasa Alpha, MARY Physician/Extender: Referring Physician: Derald Macleod in Treatment: 5 Active Inactive Orientation to the Wound Care Program Nursing Diagnoses: Knowledge deficit related to the wound healing center program Goals: Patient/caregiver will verbalize understanding of the Wound Healing Center Program Date Initiated: 07/28/2015 Goal Status: Active Interventions: Provide education  on orientation to the wound center Notes: Pressure Nursing Diagnoses: Knowledge deficit related to causes and risk factors for pressure ulcer development Knowledge deficit related to management of pressures ulcers Potential for impaired tissue integrity related to pressure, friction, moisture, and shear Goals: Patient will remain free from development of additional pressure ulcers Date Initiated: 07/28/2015 Goal Status: Active Patient will remain free of pressure ulcers Date Initiated: 07/28/2015 Goal Status: Active Patient/caregiver will verbalize risk factors for pressure ulcer development Date Initiated: 07/28/2015 Goal Status: Active Patient/caregiver will verbalize understanding of pressure ulcer management Date Initiated: 07/28/2015 Madeline Cruz ([id number]) Goal Status: Active Interventions: Assess: immobility, friction, shearing, incontinence upon admission and as needed Assess offloading mechanisms upon admission and as needed Assess potential for pressure ulcer upon admission and as needed Provide education on pressure ulcers Treatment Activities: Patient referred for home evaluation of offloading devices/mattresses : 09/01/2015 Patient referred for pressure reduction/relief devices : 09/01/2015 Patient referred for seating evaluation to ensure proper offloading : 09/01/2015 Pressure reduction/relief device ordered : 09/01/2015 Notes: Wound/Skin Impairment Nursing Diagnoses: Impaired tissue integrity Knowledge deficit related to smoking impact on wound healing Knowledge deficit related to ulceration/compromised skin integrity Goals: Patient/caregiver will verbalize understanding of skin care regimen Date Initiated: 07/28/2015 Goal Status: Active Ulcer/skin breakdown will have a volume reduction of 30% by week 4 Date Initiated: 07/28/2015 Goal Status: Active Ulcer/skin breakdown will have a volume reduction of 50% by week 8 Date Initiated:  07/28/2015 Goal Status: Active Ulcer/skin  breakdown will have a volume reduction of 80% by week 12 Date Initiated: 07/28/2015 Goal Status: Active Ulcer/skin breakdown will heal within 14 weeks Date Initiated: 07/28/2015 Goal Status: Active Interventions: Assess patient/caregiver ability to perform ulcer/skin care regimen upon admission and as needed Assess ulceration(s) every visit Provide education on ulcer and skin care Madeline RoupUCKER, Ivannia ([id number]) Treatment Activities: Skin care regimen initiated : 09/01/2015 Topical wound management initiated : 09/01/2015 Notes: Electronic Signature(s) Signed: 09/01/2015 4:31:50 PM By: Elpidio EricAfful, Rita BSN, RN Entered By: Elpidio EricAfful, Rita on 09/01/2015 13:16:30 Madeline RoupUCKER, Jeiry ([id number]) -------------------------------------------------------------------------------- Pain Assessment Details Patient Name: Madeline RoupUCKER, Burnett Date of Service: 09/01/2015 12:45 PM Medical Record Patient Account Number: [id number] Number: Afful, RN, BSN, Treating RN: Date of Birth/Sex: August 31, 1919 (82 y.o. Female[facility] [place] [place] Other Clinician: Physician: BETH Treating Britto, Threasa AlphaErrol [place], MARY Physician/Extender: Referring Physician: Derald MacleodBETH Weeks in Treatment: 5 Active Problems Location of Pain Severity and Description of Pain Patient Has Paino No Site Locations Pain Management and Medication Current Pain Management: Electronic Signature(s) Signed: 09/01/2015 4:31:50 PM By: Elpidio EricAfful, Rita BSN, RN Entered By: Elpidio EricAfful, Rita on 09/01/2015 12:56:24 Madeline RoupUCKER, Charnette ([id number]) -------------------------------------------------------------------------------- Patient/Caregiver Education Details Patient Name: Madeline RoupUCKER, Laretta Date of Service: 09/01/2015 12:45 PM Medical Record Patient Account Number: [id number] Number: Afful, RN, BSN, Treating RN: Date of Birth/Gender: August 31, 1919 (82 y.o. Female[facility] [place] Other Clinician: Physician: BETH Treating Britto,  Threasa AlphaErrol [place], MARY Physician/Extender: Referring Physician: Derald MacleodBETH Weeks in Treatment: 5 Education Assessment Education Provided To: Patient Education Topics Provided Pressure: Methods: Explain/Verbal Responses: State content correctly Welcome To The Wound Care Center: Methods: Explain/Verbal Responses: State content correctly Wound Debridement: Methods: Explain/Verbal Responses: State content correctly Wound/Skin Impairment: Methods: Explain/Verbal Responses: State content correctly Electronic Signature(s) Signed: 09/01/2015 4:31:50 PM By: Elpidio EricAfful, Rita BSN, RN Entered By: Elpidio EricAfful, Rita on 09/01/2015 13:25:04 Madeline RoupUCKER, Kaylean ([id number]) -------------------------------------------------------------------------------- Wound Assessment Details Patient Name: Madeline RoupUCKER, Malisa Date of Service: 09/01/2015 12:45 PM Medical Record Patient Account Number: [id number] Number: Afful, RN, BSN, Treating RN: Date of Birth/Sex: August 31, 1919 (82 y.o. Female[facility] [place] [place] Other Clinician: Physician: BETH Treating Britto, Threasa AlphaErrol [place], MARY Physician/Extender: Referring Physician: Derald MacleodBETH Weeks in Treatment: 5 Wound Status Wound Number: 1 Primary Pressure Ulcer Etiology: Wound Location: Left Foot - Lateral Wound Open Wounding Event: Pressure Injury Status: Date Acquired: 05/10/2015 Comorbid Cataracts, Asthma, Arrhythmia, History Weeks Of Treatment: 5 History: of pressure wounds, Osteoarthritis, Clustered Wound: No Dementia Photos Photo Uploaded By: Elpidio EricAfful, Rita on 09/01/2015 16:30:09 Wound Measurements Length: (cm) 3.3 Width: (cm) 1.7 Depth: (cm) 0.2 Area: (cm) 4.406 Volume: (cm) 0.881 % Reduction in Area: 6.5% % Reduction in Volume: 6.5% Epithelialization: None Tunneling: No Undermining: No Wound Description Classification: Unstageable/Unclassified Madeline RoupUCKER, Asiana ([id number]) Foul Odor After Cleansing: No Wound Margin: Distinct, outline  attached Exudate Amount: Large Exudate Type: Serosanguineous Exudate Color: red, brown Wound Bed Granulation Amount: Small (1-33%) Exposed Structure Granulation Quality: Pink, Pale Fascia Exposed: No Necrotic Amount: Large (67-100%) Fat Layer Exposed: No Necrotic Quality: Adherent Slough Tendon Exposed: No Muscle Exposed: No Joint Exposed: No Bone Exposed: No Limited to Skin Breakdown Periwound Skin Texture Texture Color No Abnormalities Noted: No No Abnormalities Noted: No Callus: No Atrophie Blanche: No Crepitus: No Cyanosis: No Excoriation: No Ecchymosis: No Fluctuance: No Erythema: No Friable: No Hemosiderin Staining: No Induration: No Mottled: No Localized Edema: Yes Pallor: No Rash: No Rubor: No Scarring: No Temperature / Pain Moisture Temperature: No Abnormality No Abnormalities Noted: No Tenderness on Palpation: Yes Dry /  Scaly: No Maceration: Yes Moist: Yes Wound Preparation Ulcer Cleansing: Rinsed/Irrigated with Saline Topical Anesthetic Applied: Other: lidocaine 4%, Treatment Notes Wound #1 (Left, Lateral Foot) 1. Cleansed with: Clean wound with Normal Saline 3. Peri-wound Care: Skin Prep 4. Dressing Applied: Santyl Ointment 5. Secondary Dressing Applied MALAAK, STACH ([id number]) Bordered Foam Dressing Dry Gauze 6. Footwear/Offloading device applied Other footwear/offloading device applied (specify in notes) Notes sage bootS Electronic Signature(s) Signed: 09/01/2015 4:31:50 PM By: Elpidio Eric BSN, RN Entered By: Elpidio Eric on 09/01/2015 13:03:15 Madeline Cruz ([id number]) -------------------------------------------------------------------------------- Wound Assessment Details Patient Name: Madeline Cruz Date of Service: 09/01/2015 12:45 PM Medical Record Patient Account Number: [id number] Number: Afful, RN, BSN, Treating RN: Date of Birth/Sex: [birth year] (82 y.o. Female) [facility], [place] Other  Clinician: Physician: BETH Treating Britto, Threasa Alpha, MARY Physician/Extender: Referring Physician: Derald Macleod in Treatment: 5 Wound Status Wound Number: 2 Primary Pressure Ulcer Etiology: Wound Location: Left Malleolus - Lateral Wound Open Wounding Event: Pressure Injury Status: Date Acquired: 05/10/2015 Comorbid Cataracts, Asthma, Arrhythmia, History Weeks Of Treatment: 5 History: of pressure wounds, Osteoarthritis, Clustered Wound: No Dementia Photos Photo Uploaded By: Elpidio Eric on 09/01/2015 16:30:10 Wound Measurements Length: (cm) 2.3 Width: (cm) 1.5 Depth: (cm) 0.2 Area: (cm) 2.71 Volume: (cm) 0.542 % Reduction in Area: 31% % Reduction in Volume: 31% Epithelialization: None Tunneling: No Undermining: No Wound Description Classification: Unstageable/Unclassified MAGDALEN, CABANA ([id number]) Foul Odor After Cleansing: No Wound Margin: Distinct, outline attached Exudate Amount: Medium Exudate Type: Serosanguineous Exudate Color: red, brown Wound Bed Granulation Amount: Small (1-33%) Exposed Structure Granulation Quality: Red, Pink Fascia Exposed: No Necrotic Amount: Large (67-100%) Fat Layer Exposed: No Necrotic Quality: Adherent Slough Tendon Exposed: No Muscle Exposed: No Joint Exposed: No Bone Exposed: No Limited to Skin Breakdown Periwound Skin Texture Texture Color No Abnormalities Noted: No No Abnormalities Noted: No Callus: No Atrophie Blanche: No Crepitus: No Cyanosis: No Excoriation: No Ecchymosis: No Fluctuance: No Erythema: No Friable: No Hemosiderin Staining: No Induration: No Mottled: No Localized Edema: Yes Pallor: No Rash: No Rubor: No Scarring: No Temperature / Pain Moisture Temperature: No Abnormality No Abnormalities Noted: No Tenderness on Palpation: Yes Dry / Scaly: No Maceration: Yes Moist: Yes Wound Preparation Ulcer Cleansing: Rinsed/Irrigated with Saline Topical Anesthetic Applied: Other:  lidocaine 4%, Treatment Notes Wound #2 (Left, Lateral Malleolus) 1. Cleansed with: Clean wound with Normal Saline 3. Peri-wound Care: Skin Prep 4. Dressing Applied: Santyl Ointment 5. Secondary Dressing Applied BIDDIE, SEBEK ([id number]) Bordered Foam Dressing Dry Gauze 6. Footwear/Offloading device applied Other footwear/offloading device applied (specify in notes) Notes sage bootS Electronic Signature(s) Signed: 09/01/2015 4:31:50 PM By: Elpidio Eric BSN, RN Entered By: Elpidio Eric on 09/01/2015 13:03:27 Madeline Cruz ([id number]) -------------------------------------------------------------------------------- Vitals Details Patient Name: Madeline Cruz Date of Service: 09/01/2015 12:45 PM Medical Record Patient Account Number: [id number] Number: Afful, RN, BSN, Treating RN: Date of Birth/Sex: 04/07/1920 (83 y.o. Female) [facility], [place] Other Clinician: Physician: BETH Treating Britto, Threasa Alpha, MARY Physician/Extender: Referring Physician: Derald Macleod in Treatment: 5 Vital Signs Time Taken: 13:03 Temperature (F): 96.4 Height (in): 59 Pulse (bpm): 67 Respiratory Rate (breaths/min): 16 Blood Pressure (mmHg): 136/66 Reference Range: 80 - 120 mg / dl Electronic Signature(s) Signed: 09/01/2015 4:31:50 PM By: Elpidio Eric BSN, RN Entered By: Elpidio Eric on 09/01/2015 13:15:45

## 2015-09-02 NOTE — Progress Notes (Signed)
Madeline Cruz, Madeline Cruz ([id number]) Visit Report for 09/01/2015 Chief Complaint Document Details Patient Name: Madeline Cruz, Madeline Cruz Date of Service: 09/01/2015 12:45 PM Medical Record Patient Account Number: [id number] Number: Afful, RN, BSN, Treating RN: Date of Birth/Sex: 1920-01-11 (82 y.o. Female) [facility], [place] Other Clinician: Physician: BETH Treating Marquisa Salih, Threasa Alpha, MARY Physician/Extender: Referring Physician: Derald Macleod in Treatment: 5 Information Obtained from: Patient Chief Complaint Patient is at the clinic for treatment of an open pressure ulcer with a arterial competent and this has been there on the left foot since the last 3 months Electronic Signature(s) Signed: 09/01/2015 1:27:39 PM By: Evlyn Kanner MD, FACS Entered By: Evlyn Kanner on 09/01/2015 13:27:39 Madeline Cruz ([id number]) -------------------------------------------------------------------------------- Debridement Details Patient Name: Madeline Cruz Date of Service: 09/01/2015 12:45 PM Medical Record Patient Account Number: [id number] Number: Afful, RN, BSN, Treating RN: Date of Birth/Sex: [birth year] (82 y.o. Female) [facility], [place] Other Clinician: Physician: BETH Treating Torrie Namba, Threasa Alpha, MARY Physician/Extender: Referring Physician: Derald Macleod in Treatment: 5 Debridement Performed for Wound #1 Left,Lateral Foot Assessment: Performed By: Physician Evlyn Kanner, MD Debridement: Debridement Pre-procedure Yes Verification/Time Out Taken: Start Time: 13:14 Pain Control: Lidocaine 4% Topical Solution Level: Skin/Subcutaneous Tissue Total Area Debrided (L x 3.3 (cm) x 1.7 (cm) = 5.61 (cm) W): Tissue and other Non-Viable, Fibrin/Slough, Subcutaneous material debrided: Instrument: Curette Bleeding: Minimum Hemostasis Achieved: Pressure End Time: 13:18 Procedural Pain: 0 Post Procedural Pain: 0 Response to Treatment:  Procedure was tolerated well Post Debridement Measurements of Total Wound Length: (cm) 3.3 Stage: Category/Stage III Width: (cm) 1.7 Depth: (cm) 0.2 Volume: (cm) 0.881 Post Procedure Diagnosis Same as Pre-procedure Electronic Signature(s) Signed: 09/01/2015 1:27:19 PM By: Evlyn Kanner MD, FACS Signed: 09/01/2015 4:31:50 PM By: Elpidio Eric BSN, RN Entered By: Evlyn Kanner on 09/01/2015 13:27:19 Madeline Cruz ([name] ([id number]) -------------------------------------------------------------------------------- Debridement Details Patient Name: Madeline Cruz Date of Service: 09/01/2015 12:45 PM Medical Record Patient Account Number: [id number] Number: Afful, RN, BSN, Treating RN: Date of Birth/Sex: 1919/12/18 (82 y.o. Female) [facility] [place] Other Clinician: Physician: BETH Treating Elizah Lydon, Threasa Alpha, MARY Physician/Extender: Referring Physician: Derald Macleod in Treatment: 5 Debridement Performed for Wound #2 Left,Lateral Malleolus Assessment: Performed By: Physician Evlyn Kanner, MD Debridement: Debridement Pre-procedure Yes Verification/Time Out Taken: Start Time: 13:18 Pain Control: Lidocaine 4% Topical Solution Level: Skin/Subcutaneous Tissue Total Area Debrided (L x 2.3 (cm) x 1.5 (cm) = 3.45 (cm) W): Tissue and other Viable, Non-Viable, Fibrin/Slough, Subcutaneous material debrided: Instrument: Curette Bleeding: Minimum Hemostasis Achieved: Pressure End Time: 13:22 Procedural Pain: 0 Post Procedural Pain: 0 Response to Treatment: Procedure was tolerated well Post Debridement Measurements of Total Wound Length: (cm) 2.3 Stage: Unstageable/Unclassified Width: (cm) 1.5 Depth: (cm) 0.2 Volume: (cm) 0.542 Post Procedure Diagnosis Same as Pre-procedure Electronic Signature(s) Signed: 09/01/2015 1:27:32 PM By: Evlyn Kanner MD, FACS Signed: 09/01/2015 4:31:50 PM By: Elpidio Eric BSN, RN Entered By:  Evlyn Kanner on 09/01/2015 13:27:32 Madeline Cruz ([name] ([id number]) -------------------------------------------------------------------------------- HPI Details Patient Name: Madeline Cruz Date of Service: 09/01/2015 12:45 PM Medical Record Patient Account Number: [id number] Number: Afful, RN, BSN, Treating RN: Date of Birth/Sex: 12-28-1919 (82 y.o. Female) [facility], [place] Other Clinician: Physician: BETH Treating Khing Belcher, Threasa Alpha, MARY Physician/Extender: Referring Physician: Derald Macleod in Treatment: 5 History of Present Illness Location: left lateral foot Quality: Patient reports experiencing a sharp pain to affected area(s). Severity: Patient states wound are getting worse. Duration: Patient has had the wound for > 3 months  prior to seeking treatment at the wound center Timing: Pain in wound is Intermittent (comes and goes Context: The wound appeared gradually over time Modifying Factors: Other treatment(s) tried include:local care and offloading Associated Signs and Symptoms: Patient reports having difficulty standing for long periods. HPI Description: 82 year old patient who has history of advanced dementia and has been living at a nursing home for a long while. She was a smoker in the past and quit smoking at the age of 62 and has a past medical history of advanced dementia, hyponatremia, urinary tract infection, failure to thrive, macular degeneration and glaucoma. She is also status post appendectomy. 08/04/2015 -- had a left foot x-ray which shows no acute fracture or dislocation. There is no suspicious periostitis or cortical destruction Electronic Signature(s) Signed: 09/01/2015 1:27:44 PM By: Evlyn Kanner MD, FACS Entered By: Evlyn Kanner on 09/01/2015 13:27:44 Madeline Cruz ([id number]) -------------------------------------------------------------------------------- Physical Exam Details Patient Name:  Madeline Cruz Date of Service: 09/01/2015 12:45 PM Medical Record Patient Account Number: [id number] Number: Afful, RN, BSN, Treating RN: Date of Birth/Sex: 1919-11-05 (82 y.o. Female) [facility], [place] Other Clinician: Physician: BETH Treating Terissa Haffey, Threasa Alpha, MARY Physician/Extender: Referring Physician: Derald Macleod in Treatment: 5 Constitutional . Pulse regular. Respirations normal and unlabored. Afebrile. . Eyes Nonicteric. Reactive to light. Ears, Nose, Mouth, and Throat Lips, teeth, and gums WNL.Marland Kitchen Moist mucosa without lesions. Neck supple and nontender. No palpable supraclavicular or cervical adenopathy. Normal sized without goiter. Respiratory WNL. No retractions.. Cardiovascular Pedal Pulses WNL. No clubbing, cyanosis or edema. Chest Breasts symmetical and no nipple discharge.. Breast tissue WNL, no masses, lumps, or tenderness.. Lymphatic No adneopathy. No adenopathy. No adenopathy. Musculoskeletal Adexa without tenderness or enlargement.. Digits and nails w/o clubbing, cyanosis, infection, petechiae, ischemia, or inflammatory conditions.. Integumentary (Hair, Skin) No suspicious lesions. No crepitus or fluctuance. No peri-wound warmth or erythema. No masses.Marland Kitchen Psychiatric Judgement and insight Intact.. No evidence of depression, anxiety, or agitation.. Notes the patient has minimal subcutaneous debris and after cleaning it out well I have curetted with a #3 curet and removed all the subcutaneous tissue down to the clean granulation. Bleeding controlled with pressure Electronic Signature(s) Signed: 09/01/2015 1:28:39 PM By: Evlyn Kanner MD, FACS Entered By: Evlyn Kanner on 09/01/2015 13:28:39 Madeline Cruz ([id number]) -------------------------------------------------------------------------------- Physician Orders Details Patient Name: Madeline Cruz Date of Service: 09/01/2015 12:45 PM Medical Record Patient Account Number:  [id number] Number: Afful, RN, BSN, Treating RN: Date of Birth/Sex: [birth year] (82 y.o. Female) [facility], [place] Other Clinician: Physician: BETH Treating Kinlie Janice, Threasa Alpha, MARY Physician/Extender: Referring Physician: Derald Macleod in Treatment: 5 Verbal / Phone Orders: Yes Clinician: Afful, RN, BSN, Rita Read Back and Verified: Yes Diagnosis Coding Wound Cleansing Wound #1 Left,Lateral Foot o Clean wound with Normal Saline. Wound #2 Left,Lateral Malleolus o Clean wound with Normal Saline. Anesthetic Wound #1 Left,Lateral Foot o Topical Lidocaine 4% cream applied to wound bed prior to debridement Wound #2 Left,Lateral Malleolus o Topical Lidocaine 4% cream applied to wound bed prior to debridement Skin Barriers/Peri-Wound Care Wound #1 Left,Lateral Foot o Skin Prep Wound #2 Left,Lateral Malleolus o Skin Prep Primary Wound Dressing Wound #1 Left,Lateral Foot o Santyl Ointment Wound #2 Left,Lateral Malleolus o Santyl Ointment Secondary Dressing Wound #1 Left,Lateral Foot o Boardered Foam Dressing Wound #2 Left,Lateral Malleolus Madeline Cruz, Madeline Cruz ([id number]) o Boardered Foam Dressing Dressing Change Frequency Wound #1 Left,Lateral Foot o Change dressing every day. Wound #2 Left,Lateral Malleolus o Change dressing every day. Follow-up Appointments Wound #1 Left,Lateral Foot   o Return Appointment in 1 week. Wound #2 Left,Lateral Malleolus o Return Appointment in 1 week. Off-Loading Wound #1 Left,Lateral Foot o Heel suspension boot to: - SAGE BOOTS Wound #2 Left,Lateral Malleolus o Heel suspension boot to: - SAGE BOOTS Additional Orders / Instructions Wound #1 Left,Lateral Foot o Increase protein intake. o Other: - ZInc oxide. MVI, Vitamin C Wound #2 Left,Lateral Malleolus o Increase protein intake. - ZInc oxide. MVI, Vitamin C Home Health Wound #1 Left,Lateral Foot o Continue Home Health  Visits - [facility] o Home Health Nurse may visit PRN to address patientos wound care needs. o FACE TO FACE ENCOUNTER: MEDICARE and MEDICAID PATIENTS: I certify that this patient is under my care and that I had a face-to-face encounter that meets the physician face-to-face encounter requirements with this patient on this date. The encounter with the patient was in whole or in part for the following MEDICAL CONDITION: (primary reason for Home Healthcare) MEDICAL NECESSITY: I certify, that based on my findings, NURSING services are a medically necessary home health service. HOME BOUND STATUS: I certify that my clinical findings support that this patient is homebound (i.e., Due to illness or injury, pt requires aid of supportive devices such as crutches, cane, wheelchairs, walkers, the use of special transportation or the assistance of another person to leave their place of residence. There is a normal inability to leave the home and doing so requires considerable and taxing effort. Other absences are for medical reasons / religious services and are infrequent or of short duration when for other reasons). Madeline Cruz, Madeline Cruz ([id number]) o If current dressing causes regression in wound condition, may D/C ordered dressing product/s and apply Normal Saline Moist Dressing daily until next Wound Healing Center / Other MD appointment. Notify Wound Healing Center of regression in wound condition at [phone]. o Please direct any NON-WOUND related issues/requests for orders to patient's Primary Care Physician Wound #2 Left,Lateral Malleolus o Continue Home Health Visits - [facility] and [facility] care o Home Health Nurse may visit PRN to address patientos wound care needs. o FACE TO FACE ENCOUNTER: MEDICARE and MEDICAID PATIENTS: I certify that this patient is under my care and that I had a face-to-face encounter that meets the physician  face-to-face encounter requirements with this patient on this date. The encounter with the patient was in whole or in part for the following MEDICAL CONDITION: (primary reason for Home Healthcare) MEDICAL NECESSITY: I certify, that based on my findings, NURSING services are a medically necessary home health service. HOME BOUND STATUS: I certify that my clinical findings support that this patient is homebound (i.e., Due to illness or injury, pt requires aid of supportive devices such as crutches, cane, wheelchairs, walkers, the use of special transportation or the assistance of another person to leave their place of residence. There is a normal inability to leave the home and doing so requires considerable and taxing effort. Other absences are for medical reasons / religious services and are infrequent or of short duration when for other reasons). o If current dressing causes regression in wound condition, may D/C ordered dressing product/s and apply Normal Saline Moist Dressing daily until next Wound Healing Center / Other MD appointment. Notify Wound Healing Center of regression in wound condition at [phone]. o Please direct any NON-WOUND related issues/requests for orders to patient's Primary Care Physician Medications-please add to medication list. Wound #1 Left,Lateral Foot o Santyl Enzymatic Ointment o Other: - ZINC, ViTAMIN C, MVI Wound #2  Left,Lateral Malleolus o Santyl Enzymatic Ointment o Other: - ZINC, ViTAMIN C, MVI Electronic Signature(s) Signed: 09/01/2015 4:30:51 PM By: Evlyn Kanner MD, FACS Signed: 09/01/2015 4:31:50 PM By: Elpidio Eric BSN, RN Entered By: Elpidio Eric on 09/01/2015 13:19:11 Madeline Cruz, Madeline Cruz ([id number]) -------------------------------------------------------------------------------- Prescription 09/01/2015 Patient Name: Madeline Cruz Physician: Evlyn Kanner MD Date of Birth: 10-14-1919 NPI#: [id number] Sex: F DEA#: [id number] Phone #:  [phone] License #: Patient Address: [facility] ATTN [place] [address] [facility] [place], [place] [address] [address], [address] [place], [place] [address] [phone] Allergies sulfa Antibiotics Physician's Orders Santyl Enzymatic Ointment Signature(s): Date(s): Electronic Signature(s) Signed: 09/01/2015 4:30:51 PM By: Evlyn Kanner MD, FACS Signed: 09/01/2015 4:31:50 PM By: Elpidio Eric BSN, RN Entered By: Elpidio Eric on 09/01/2015 13:19:11 Madeline Cruz ([id number]) --------------------------------------------------------------------------------  Problem List Details Patient Name: Madeline Cruz Date of Service: 09/01/2015 12:45 PM Medical Record Patient Account Number: [id number] Number: Afful, RN, BSN, Treating RN: Date of Birth/Sex: 12/12/1919 (82 y.o. Female) [facility], [place] Other Clinician: Physician: BETH Treating Ashyia Schraeder, Threasa Alpha, MARY Physician/Extender: Referring Physician: Derald Macleod in Treatment: 5 Active Problems ICD-10 Encounter Code Description Active Date Diagnosis L89.523 Pressure ulcer of left ankle, stage 3 07/28/2015 Yes G30.1 Alzheimer's disease with late onset 07/28/2015 Yes Z87.891 Personal history of nicotine dependence 07/28/2015 Yes I70.245 Atherosclerosis of native arteries of left leg with ulceration 07/28/2015 Yes of other part of foot Inactive Problems Resolved Problems Electronic Signature(s) Signed: 09/01/2015 1:27:08 PM By: Evlyn Kanner MD, FACS Entered By: Evlyn Kanner on 09/01/2015 13:27:08 Madeline Cruz ([id number]) -------------------------------------------------------------------------------- Progress Note Details Patient Name: Madeline Cruz Date of Service: 09/01/2015 12:45 PM Medical Record Patient Account Number: [id number] Number: Afful, RN, BSN, Treating RN: Date of Birth/Sex: 1919/10/05 (82 y.o.  Female) [facility], [place] Other Clinician: Physician: BETH Treating Josaiah Muhammed, Threasa Alpha, MARY Physician/Extender: Referring Physician: Derald Macleod in Treatment: 5 Subjective Chief Complaint Information obtained from Patient Patient is at the clinic for treatment of an open pressure ulcer with a arterial competent and this has been there on the left foot since the last 3 months History of Present Illness (HPI) The following HPI elements were documented for the patient's wound: Location: left lateral foot Quality: Patient reports experiencing a sharp pain to affected area(s). Severity: Patient states wound are getting worse. Duration: Patient has had the wound for > 3 months prior to seeking treatment at the wound center Timing: Pain in wound is Intermittent (comes and goes Context: The wound appeared gradually over time Modifying Factors: Other treatment(s) tried include:local care and offloading Associated Signs and Symptoms: Patient reports having difficulty standing for long periods. 82 year old patient who has history of advanced dementia and has been living at a nursing home for a long while. She was a smoker in the past and quit smoking at the age of 3 and has a past medical history of advanced dementia, hyponatremia, urinary tract infection, failure to thrive, macular degeneration and glaucoma. She is also status post appendectomy. 08/04/2015 -- had a left foot x-ray which shows no acute fracture or dislocation. There is no suspicious periostitis or cortical destruction Objective Constitutional Pulse regular. Respirations normal and unlabored. Afebrile. Vitals Time Taken: 1:03 PM, Height: 59 in, Temperature: 96.4 F, Pulse: 67 bpm, Respiratory Rate: 16 Madeline Cruz, Madeline Cruz ([id number]) breaths/min, Blood Pressure: 136/66 mmHg. Eyes Nonicteric. Reactive to light. Ears, Nose, Mouth, and Throat Lips, teeth, and gums WNL.Marland Kitchen Moist mucosa without  lesions. Neck  supple and nontender. No palpable supraclavicular or cervical adenopathy. Normal sized without goiter. Respiratory WNL. No retractions.. Cardiovascular Pedal Pulses WNL. No clubbing, cyanosis or edema. Chest Breasts symmetical and no nipple discharge.. Breast tissue WNL, no masses, lumps, or tenderness.. Lymphatic No adneopathy. No adenopathy. No adenopathy. Musculoskeletal Adexa without tenderness or enlargement.. Digits and nails w/o clubbing, cyanosis, infection, petechiae, ischemia, or inflammatory conditions.Marland Kitchen Psychiatric Judgement and insight Intact.. No evidence of depression, anxiety, or agitation.. General Notes: the patient has minimal subcutaneous debris and after cleaning it out well I have curetted with a #3 curet and removed all the subcutaneous tissue down to the clean granulation. Bleeding controlled with pressure Integumentary (Hair, Skin) No suspicious lesions. No crepitus or fluctuance. No peri-wound warmth or erythema. No masses.. Wound #1 status is Open. Original cause of wound was Pressure Injury. The wound is located on the Left,Lateral Foot. The wound measures 3.3cm length x 1.7cm width x 0.2cm depth; 4.406cm^2 area and 0.881cm^3 volume. The wound is limited to skin breakdown. There is no tunneling or undermining noted. There is a large amount of serosanguineous drainage noted. The wound margin is distinct with the outline attached to the wound base. There is small (1-33%) pink, pale granulation within the wound bed. There is a large (67-100%) amount of necrotic tissue within the wound bed including Adherent Slough. The periwound skin appearance exhibited: Localized Edema, Maceration, Moist. The periwound skin appearance did not exhibit: Callus, Crepitus, Excoriation, Fluctuance, Friable, Induration, Rash, Scarring, Dry/Scaly, Atrophie Blanche, Cyanosis, Ecchymosis, Hemosiderin Staining, Mottled, Pallor, Rubor, Erythema. Periwound temperature was  noted as No Abnormality. The periwound has tenderness on palpation. Wound #2 status is Open. Original cause of wound was Pressure Injury. The wound is located on the [place] ([id number]) Left,Lateral Malleolus. The wound measures 2.3cm length x 1.5cm width x 0.2cm depth; 2.71cm^2 area and 0.542cm^3 volume. The wound is limited to skin breakdown. There is no tunneling or undermining noted. There is a medium amount of serosanguineous drainage noted. The wound margin is distinct with the outline attached to the wound base. There is small (1-33%) red, pink granulation within the wound bed. There is a large (67-100%) amount of necrotic tissue within the wound bed including Adherent Slough. The periwound skin appearance exhibited: Localized Edema, Maceration, Moist. The periwound skin appearance did not exhibit: Callus, Crepitus, Excoriation, Fluctuance, Friable, Induration, Rash, Scarring, Dry/Scaly, Atrophie Blanche, Cyanosis, Ecchymosis, Hemosiderin Staining, Mottled, Pallor, Rubor, Erythema. Periwound temperature was noted as No Abnormality. The periwound has tenderness on palpation. Assessment Active Problems ICD-10 L89.523 - Pressure ulcer of left ankle, stage 3 G30.1 - Alzheimer's disease with late onset Z87.891 - Personal history of nicotine dependence I70.245 - Atherosclerosis of native arteries of left leg with ulceration of other part of foot Procedures Wound #1 Wound #1 is a Pressure Ulcer located on the Left,Lateral Foot . There was a Skin/Subcutaneous Tissue Debridement ([id number]) debridement with total area of 5.61 sq cm performed by Evlyn Kanner, MD. with the following instrument(s): Curette to remove Non-Viable tissue/material including Fibrin/Slough and Subcutaneous after achieving pain control using Lidocaine 4% Topical Solution. A time out was conducted prior to the start of the procedure. A Minimum amount of bleeding was controlled with Pressure. The procedure was  tolerated well with a pain level of 0 throughout and a pain level of 0 following the procedure. Post Debridement Measurements: 3.3cm length x 1.7cm width x 0.2cm depth; 0.881cm^3 volume. Post debridement Stage noted as Category/Stage III. Post procedure Diagnosis Wound #1: Same as Pre-Procedure  Wound #2 Wound #2 is a Pressure Ulcer located on the Left,Lateral Malleolus . There was a Skin/Subcutaneous Tissue Debridement ([id number]) debridement with total area of 3.45 sq cm performed by Evlyn KannerBritto, Leontyne Manville, MD. with the following instrument(s): Curette to remove Viable and Non-Viable tissue/material including Fibrin/Slough and Subcutaneous after achieving pain control using Lidocaine 4% Topical Solution. A time out was conducted prior to the start of the procedure. A Minimum amount of bleeding was controlled with Pressure. The procedure was tolerated well with a pain level of 0 throughout and a pain level of 0 following Madeline RoupUCKER, Madeline Cruz ([id number]) the procedure. Post Debridement Measurements: 2.3cm length x 1.5cm width x 0.2cm depth; 0.542cm^3 volume. Post debridement Stage noted as Unstageable/Unclassified. Post procedure Diagnosis Wound #2: Same as Pre-Procedure Plan Wound Cleansing: Wound #1 Left,Lateral Foot: Clean wound with Normal Saline. Wound #2 Left,Lateral Malleolus: Clean wound with Normal Saline. Anesthetic: Wound #1 Left,Lateral Foot: Topical Lidocaine 4% cream applied to wound bed prior to debridement Wound #2 Left,Lateral Malleolus: Topical Lidocaine 4% cream applied to wound bed prior to debridement Skin Barriers/Peri-Wound Care: Wound #1 Left,Lateral Foot: Skin Prep Wound #2 Left,Lateral Malleolus: Skin Prep Primary Wound Dressing: Wound #1 Left,Lateral Foot: Santyl Ointment Wound #2 Left,Lateral Malleolus: Santyl Ointment Secondary Dressing: Wound #1 Left,Lateral Foot: Boardered Foam Dressing Wound #2 Left,Lateral Malleolus: Boardered Foam Dressing Dressing Change  Frequency: Wound #1 Left,Lateral Foot: Change dressing every day. Wound #2 Left,Lateral Malleolus: Change dressing every day. Follow-up Appointments: Wound #1 Left,Lateral Foot: Return Appointment in 1 week. Wound #2 Left,Lateral Malleolus: Return Appointment in 1 week. Off-Loading: Wound #1 Left,Lateral Foot: Heel suspension boot to: - SAGE BOOTS Wound #2 Left,Lateral Malleolus: Madeline RoupUCKER, Madeline Cruz ([id number]) Heel suspension boot to: - SAGE BOOTS Additional Orders / Instructions: Wound #1 Left,Lateral Foot: Increase protein intake. Other: - ZInc oxide. MVI, Vitamin C Wound #2 Left,Lateral Malleolus: Increase protein intake. - ZInc oxide. MVI, [facility]: Wound #1 Left,Lateral Foot: Continue Home Health Visits - [facility] Health Nurse may visit PRN to address patient s wound care needs. FACE TO FACE ENCOUNTER: MEDICARE and MEDICAID PATIENTS: I certify that this patient is under my care and that I had a face-to-face encounter that meets the physician face-to-face encounter requirements with this patient on this date. The encounter with the patient was in whole or in part for the following MEDICAL CONDITION: (primary reason for Home Healthcare) MEDICAL NECESSITY: I certify, that based on my findings, NURSING services are a medically necessary home health service. HOME BOUND STATUS: I certify that my clinical findings support that this patient is homebound (i.e., Due to illness or injury, pt requires aid of supportive devices such as crutches, cane, wheelchairs, walkers, the use of special transportation or the assistance of another person to leave their place of residence. There is a normal inability to leave the home and doing so requires considerable and taxing effort. Other absences are for medical reasons / religious services and are infrequent or of short duration when for other reasons). If current dressing causes regression in  wound condition, may D/C ordered dressing product/s and apply Normal Saline Moist Dressing daily until next Wound Healing Center / Other MD appointment. Notify Wound Healing Center of regression in wound condition at [phone]. Please direct any NON-WOUND related issues/requests for orders to patient's Primary Care Physician Wound #2 Left,Lateral Malleolus: Continue Home Health Visits - [facility] Health Nurse may visit PRN to address patient s wound care needs.  FACE TO FACE ENCOUNTER: MEDICARE and MEDICAID PATIENTS: I certify that this patient is under my care and that I had a face-to-face encounter that meets the physician face-to-face encounter requirements with this patient on this date. The encounter with the patient was in whole or in part for the following MEDICAL CONDITION: (primary reason for Home Healthcare) MEDICAL NECESSITY: I certify, that based on my findings, NURSING services are a medically necessary home health service. HOME BOUND STATUS: I certify that my clinical findings support that this patient is homebound (i.e., Due to illness or injury, pt requires aid of supportive devices such as crutches, cane, wheelchairs, walkers, the use of special transportation or the assistance of another person to leave their place of residence. There is a normal inability to leave the home and doing so requires considerable and taxing effort. Other absences are for medical reasons / religious services and are infrequent or of short duration when for other reasons). If current dressing causes regression in wound condition, may D/C ordered dressing product/s and apply Normal Saline Moist Dressing daily until next Wound Healing Center / Other MD appointment. Notify Wound Healing Center of regression in wound condition at [phone]. Please direct any NON-WOUND related issues/requests for orders to patient's Primary Care Physician Medications-please add to  medication list.: Wound #1 Left,Lateral Foot: Santyl Enzymatic Ointment Other: - ZINC, ViTAMIN C, MVI Wound #2 Left,Lateral Malleolus: Santyl Enzymatic Ointment Other: - ZINC, ViTAMIN C, MVI Madeline Cruz, Madeline Cruz ([id number]) I have recommended we continue with Santyl ointment locally, offloading and appropriate nutritional supplements including vitamins. She has come back to see as next week. Electronic Signature(s) Signed: 09/01/2015 1:29:22 PM By: Evlyn Kanner MD, FACS Entered By: Evlyn Kanner on 09/01/2015 13:29:22 Madeline Cruz ([id number]) -------------------------------------------------------------------------------- SuperBill Details Patient Name: Madeline Cruz Date of Service: 09/01/2015 Medical Record Patient Account Number: [id number] Number: Afful, RN, BSN, Treating RN: Date of Birth/Sex: April 29, 1920 (82 y.o. Female) [facility], [place] Other Clinician: Physician: BETH Treating Ndia Sampath, Threasa Alpha, MARY Physician/Extender: Referring Physician: Derald Macleod in Treatment: 5 Diagnosis Coding ICD-10 Codes Code Description [phone] Pressure ulcer of left ankle, stage 3 G30.1 Alzheimer's disease with late onset Z87.891 Personal history of nicotine dependence I70.245 Atherosclerosis of native arteries of left leg with ulceration of other part of foot Facility Procedures CPT4: Description Modifier Quantity Code [id number] 11042 - DEB SUBQ TISSUE 20 SQ CM/< 1 ICD-10 Description Diagnosis L89.523 Pressure ulcer of left ankle, stage 3 G30.1 Alzheimer's disease with late onset Z87.891 Personal history of nicotine dependence  I70.245 Atherosclerosis of native arteries of left leg with ulceration of other part of foot Physician Procedures CPT4: Description Modifier Quantity Code [id number] 11042 - WC PHYS SUBQ TISS 20 SQ CM 1 ICD-10 Description Diagnosis L89.523 Pressure ulcer of left ankle, stage 3 G30.1 Alzheimer's disease with late onset Z87.891 Personal  history of nicotine dependence  I70.245 Atherosclerosis of native arteries of left leg with ulceration of other part of foot Electronic Signature(s) Madeline Cruz, Madeline Cruz ([id number]) Signed: 09/01/2015 1:29:48 PM By: Evlyn Kanner MD, FACS Entered By: Evlyn Kanner on 09/01/2015 13:29:48

## 2015-09-08 ENCOUNTER — Encounter (HOSPITAL_BASED_OUTPATIENT_CLINIC_OR_DEPARTMENT_OTHER): Payer: Medicare Other | Admitting: General Surgery

## 2015-09-08 DIAGNOSIS — L89523 Pressure ulcer of left ankle, stage 3: Secondary | ICD-10-CM | POA: Diagnosis not present

## 2015-09-08 DIAGNOSIS — I70245 Atherosclerosis of native arteries of left leg with ulceration of other part of foot: Secondary | ICD-10-CM | POA: Diagnosis not present

## 2015-09-08 NOTE — Progress Notes (Signed)
See I heal note 

## 2015-09-08 NOTE — Progress Notes (Addendum)
Madeline Cruz, Madeline Cruz ([id number]) Visit Report for 09/08/2015 Arrival Information Details Patient Name: Madeline Cruz, Madeline Cruz 09/08/2015 12:45 Date of Service: PM Medical Record [id number] Number: Patient Account Number: [id number] Date of Birth/Sex: [birth year] (82 y.o. Female) Afful, RN, BSN, Treating RN: [place] [facility] [place] Physician: BETH Other Clinician: Odelia GageMCGRANAGHAN, MARY Treating Referring Physician: Ardath SaxPARKER, PETER BETH Physician/Extender: Tania AdeWeeks in Treatment: 6 Visit Information History Since Last Visit Added or deleted any medications: No Patient Arrived: Wheel Chair Any new allergies or adverse reactions: No Arrival Time: 12:39 Had a fall or experienced change in No activities of daily living that may affect Accompanied By: dtr risk of falls: Transfer Assistance: None Signs or symptoms of abuse/neglect since last No Patient Identification Verified: Yes visito Secondary Verification Process Yes Has Dressing in Place as Prescribed: Yes Completed: Pain Present Now: No Patient Requires Transmission-Based No Precautions: Patient Has Alerts: No Electronic Signature(s) Signed: 09/08/2015 12:39:24 PM By: Elpidio EricAfful, Rita BSN, RN Entered By: Elpidio EricAfful, Rita on 09/08/2015 12:39:24 Madeline Cruz, Madeline Cruz ([id number]) -------------------------------------------------------------------------------- Encounter Discharge Information Details Patient Name: Madeline Cruz, Madeline Cruz 09/08/2015 12:45 Date of Service: PM Medical Record [id number] Number: Patient Account Number: [id number] Date of Birth/Sex: [birth year] (82 y.o. Female) Afful, RN, BSN, Treating RN: [place] [facility] [place] Physician: BETH Other Clinician: Odelia GageMCGRANAGHAN, MARY Treating Referring Physician: Ardath SaxPARKER, PETER BETH Physician/Extender: Tania AdeWeeks in Treatment: 6 Encounter Discharge Information Items Discharge Pain Level: 0 Discharge Condition: Stable Ambulatory Status: Wheelchair Discharge Destination: Nursing  Home Transportation: Other Accompanied By: dtr Schedule Follow-up Appointment: No Medication Reconciliation completed and provided to Patient/Care No Simone Tuckey: Provided on Clinical Summary of Care: 09/08/2015 Form Type Recipient Paper Patient LT Electronic Signature(s) Signed: 09/08/2015 1:20:39 PM By: Ardath SaxParker, Peter MD Previous Signature: 09/08/2015 1:14:01 PM Version By: Gwenlyn PerkingMoore, Shelia Entered By: Ardath SaxParker, Peter on 09/08/2015 13:20:39 Madeline Cruz, Madeline Cruz ([id number]) -------------------------------------------------------------------------------- Lower Extremity Assessment Details Patient Name: Madeline Cruz, Madeline Cruz 09/08/2015 12:45 Date of Service: PM Medical Record [id number] Number: Patient Account Number: [id number] Date of Birth/Sex: [birth year] (82 y.o. Female) Afful, RN, BSN, Treating RN: [place] [facility] [place] Physician: BETH Other Clinician: Odelia GageMCGRANAGHAN, MARY Treating Referring Physician: Ardath SaxPARKER, PETER BETH Physician/Extender: Tania AdeWeeks in Treatment: 6 Vascular Assessment Pulses: Posterior Tibial Dorsalis Pedis Palpable: [Left:Yes] Extremity colors, hair growth, and conditions: Extremity Color: [Left:Mottled] Hair Growth on Extremity: [Left:No] Temperature of Extremity: [Left:Warm] Capillary Refill: [Left:< 3 seconds] Toe Nail Assessment Left: Right: Thick: Yes Discolored: Yes Deformed: No Improper Length and Hygiene: No Electronic Signature(s) Signed: 09/08/2015 12:39:51 PM By: Elpidio EricAfful, Rita BSN, RN Entered By: Elpidio EricAfful, Rita on 09/08/2015 12:39:51 Madeline Cruz, Kacy ([id number]) -------------------------------------------------------------------------------- Multi Wound Chart Details Patient Name: Madeline Cruz, Madeline Cruz 09/08/2015 12:45 Date of Service: PM Medical Record [id number] Number: Patient Account Number: [id number] Date of Birth/Sex: [birth year] (82 y.o. Female) Afful, RN, BSN, Treating RN: [place] [facility] [place] Physician: BETH Other  Clinician: Odelia GageMCGRANAGHAN, MARY Treating Referring Physician: Ardath SaxPARKER, PETER BETH Physician/Extender: Tania AdeWeeks in Treatment: 6 Vital Signs Height(in): 59 Pulse(bpm): 68 Weight(lbs): Blood Pressure 104/38 (mmHg): Body Mass Index(BMI): Temperature(F): Respiratory Rate 16 (breaths/min): Photos: [1:No Photos] [2:No Photos] [N/A:N/A] Wound Location: [1:Left Foot - Lateral] [2:Left Malleolus - Lateral] [N/A:N/A] Wounding Event: [1:Pressure Injury] [2:Pressure Injury] [N/A:N/A] Primary Etiology: [1:Pressure Ulcer] [2:Pressure Ulcer] [N/A:N/A] Comorbid History: [1:Cataracts, Asthma, Arrhythmia, History of pressure wounds, Osteoarthritis, Dementia] [2:Cataracts, Asthma, Arrhythmia, History of pressure wounds, Osteoarthritis, Dementia] [N/A:N/A] Date Acquired: [1:05/10/2015] [2:05/10/2015] [N/A:N/A] Weeks of Treatment: [1:6] [2:6] [N/A:N/A] Wound Status: [1:Open] [2:Open] [N/A:N/A] Measurements L x W x D 3.5x2x0.2 [2:2.7x1.7x0.2] [N/A:N/A] (cm) Area (cm) : [1:5.498] [2:3.605] [N/A:N/A] Volume (cm) : [1:1.1] [2:0.721] [N/A:N/A] %  Reduction in Area: [1:-16.70%] [2:8.20%] [N/A:N/A] % Reduction in Volume: -16.80% [2:8.20%] [N/A:N/A] Classification: [1:Unstageable/Unclassified] [2:Unstageable/Unclassified] [N/A:N/A] Exudate Amount: [1:Large] [2:Medium] [N/A:N/A] Exudate Type: [1:Serosanguineous] [2:Serosanguineous] [N/A:N/A] Exudate Color: [1:red, brown] [2:red, brown] [N/A:N/A] Wound Margin: [1:Distinct, outline attached] [2:Distinct, outline attached] [N/A:N/A] Granulation Amount: [1:Small (1-33%)] [2:Small (1-33%)] [N/A:N/A] Granulation Quality: [1:Pink, Pale] [2:Red, Pink] [N/A:N/A] Necrotic Amount: [1:Large (67-100%)] [2:Large (67-100%)] [N/A:N/A] Exposed Structures: [1:Fascia: No Fat: No] [2:Fascia: No Fat: No] [N/A:N/A] Tendon: No Tendon: No Muscle: No Muscle: No Joint: No Joint: No Bone: No Bone: No Limited to Skin Limited to Skin Breakdown Breakdown Epithelialization: None  None N/A Periwound Skin Texture: Edema: Yes Edema: Yes N/A Excoriation: No Excoriation: No Induration: No Induration: No Callus: No Callus: No Crepitus: No Crepitus: No Fluctuance: No Fluctuance: No Friable: No Friable: No Rash: No Rash: No Scarring: No Scarring: No Periwound Skin Maceration: Yes Maceration: Yes N/A Moisture: Moist: Yes Moist: Yes Dry/Scaly: No Dry/Scaly: No Periwound Skin Color: Atrophie Blanche: No Atrophie Blanche: No N/A Cyanosis: No Cyanosis: No Ecchymosis: No Ecchymosis: No Erythema: No Erythema: No Hemosiderin Staining: No Hemosiderin Staining: No Mottled: No Mottled: No Pallor: No Pallor: No Rubor: No Rubor: No Temperature: No Abnormality No Abnormality N/A Tenderness on Yes Yes N/A Palpation: Wound Preparation: Ulcer Cleansing: Ulcer Cleansing: N/A Rinsed/Irrigated with Rinsed/Irrigated with Saline Saline Topical Anesthetic Topical Anesthetic Applied: Other: lidocaine Applied: Other: lidocaine 4% 4% Treatment Notes Electronic Signature(s) Signed: 09/08/2015 1:41:16 PM By: Elpidio Eric BSN, RN Entered By: Elpidio Eric on 09/08/2015 12:52:34 Madeline Roup ([id number]) -------------------------------------------------------------------------------- Multi-Disciplinary Care Plan Details Patient Name: TYNIYA, KUYPER 09/08/2015 12:45 Date of Service: PM Medical Record [id number] Number: Patient Account Number: [id number] Date of Birth/Sex: 09/17/1919 (82 y.o. Female) Afful, RN, BSN, Treating RN: [place] [facility] [place], [place] Physician: BETH Other Clinician: Odelia Gage Treating Referring Physician: Ardath Sax BETH Physician/Extender: Tania Ade in Treatment: 6 Active Inactive Orientation to the Wound Care Program Nursing Diagnoses: Knowledge deficit related to the wound healing center program Goals: Patient/caregiver will verbalize understanding of the Wound Healing Center Program Date Initiated: 07/28/2015 Goal  Status: Active Interventions: Provide education on orientation to the wound center Notes: Pressure Nursing Diagnoses: Knowledge deficit related to causes and risk factors for pressure ulcer development Knowledge deficit related to management of pressures ulcers Potential for impaired tissue integrity related to pressure, friction, moisture, and shear Goals: Patient will remain free from development of additional pressure ulcers Date Initiated: 07/28/2015 Goal Status: Active Patient will remain free of pressure ulcers Date Initiated: 07/28/2015 Goal Status: Active Patient/caregiver will verbalize risk factors for pressure ulcer development Date Initiated: 07/28/2015 Goal Status: Active Patient/caregiver will verbalize understanding of pressure ulcer management Date Initiated: 07/28/2015 Madeline Roup ([id number]) Goal Status: Active Interventions: Assess: immobility, friction, shearing, incontinence upon admission and as needed Assess offloading mechanisms upon admission and as needed Assess potential for pressure ulcer upon admission and as needed Provide education on pressure ulcers Treatment Activities: Patient referred for home evaluation of offloading devices/mattresses : 09/08/2015 Patient referred for pressure reduction/relief devices : 09/08/2015 Patient referred for seating evaluation to ensure proper offloading : 09/08/2015 Pressure reduction/relief device ordered : 09/08/2015 Notes: Wound/Skin Impairment Nursing Diagnoses: Impaired tissue integrity Knowledge deficit related to smoking impact on wound healing Knowledge deficit related to ulceration/compromised skin integrity Goals: Patient/caregiver will verbalize understanding of skin care regimen Date Initiated: 07/28/2015 Goal Status: Active Ulcer/skin breakdown will have a volume reduction of 30% by week 4 Date Initiated: 07/28/2015 Goal Status: Active Ulcer/skin breakdown will have a volume reduction of 50% by week  8 Date Initiated: 07/28/2015 Goal Status: Active Ulcer/skin breakdown will have a volume reduction of 80% by week 12 Date Initiated: 07/28/2015 Goal Status: Active Ulcer/skin breakdown will heal within 14 weeks Date Initiated: 07/28/2015 Goal Status: Active Interventions: Assess patient/caregiver ability to perform ulcer/skin care regimen upon admission and as needed Assess ulceration(s) every visit Provide education on ulcer and skin care HARSIMRAN, WESTMAN ([id number]) Treatment Activities: Skin care regimen initiated : 09/08/2015 Topical wound management initiated : 09/08/2015 Notes: Electronic Signature(s) Signed: 09/08/2015 1:41:16 PM By: Elpidio Eric BSN, RN Entered By: Elpidio Eric on 09/08/2015 12:52:26 Madeline Roup ([id number]) -------------------------------------------------------------------------------- Pain Assessment Details Patient Name: HAMDA, KLUTTS 09/08/2015 12:45 Date of Service: PM Medical Record [id number] Number: Patient Account Number: [id number] Date of Birth/Sex: May 06, 1920 (82 y.o. Female) Afful, RN, BSN, Treating RN: [place] [facility] [place], [place] Physician: BETH Other Clinician: Odelia Gage Treating Referring Physician: Ardath Sax BETH Physician/Extender: Tania Ade in Treatment: 6 Active Problems Location of Pain Severity and Description of Pain Patient Has Paino No Site Locations Pain Management and Medication Current Pain Management: Electronic Signature(s) Signed: 09/08/2015 12:39:32 PM By: Elpidio Eric BSN, RN Entered By: Elpidio Eric on 09/08/2015 12:39:32 Madeline Roup ([id number]) -------------------------------------------------------------------------------- Patient/Caregiver Education Details Patient Name: MYESHA, STILLION 09/08/2015 12:45 Date of Service: PM Medical Record [id number] Number: Patient Account Number: [id number] Date of Birth/Gender: 1919-06-08 (82 y.o. Female) Afful, RN, BSN, Treating RN: [place] [facility]  [place], [place] Physician: BETH Other Clinician: Odelia Gage Treating Referring Physician: Ardath Sax BETH Physician/Extender: Tania Ade in Treatment: 6 Education Assessment Education Provided To: Patient and Caregiver Education Topics Provided Pressure: Methods: Explain/Verbal Responses: State content correctly Welcome To The Wound Care Center: Methods: Explain/Verbal Responses: State content correctly Wound/Skin Impairment: Methods: Explain/Verbal Responses: State content correctly Electronic Signature(s) Signed: 09/08/2015 1:20:55 PM By: Ardath Sax MD Entered By: Ardath Sax on 09/08/2015 13:20:55 Madeline Roup ([id number]) -------------------------------------------------------------------------------- Wound Assessment Details Patient Name: RMONI, KEPLINGER 09/08/2015 12:45 Date of Service: PM Medical Record [id number] Number: Patient Account Number: [id number] Date of Birth/Sex: 08/19/1919 (82 y.o. Female) Afful, RN, BSN, Treating RN: [place] [facility] [place], [place] Physician: BETH Other Clinician: Odelia Gage Treating Referring Physician: Ardath Sax BETH Physician/Extender: Tania Ade in Treatment: 6 Wound Status Wound Number: 1 Primary Pressure Ulcer Etiology: Wound Location: Left Foot - Lateral Wound Open Wounding Event: Pressure Injury Status: Date Acquired: 05/10/2015 Comorbid Cataracts, Asthma, Arrhythmia, History Weeks Of Treatment: 6 History: of pressure wounds, Osteoarthritis, Clustered Wound: No Dementia Wound Measurements Length: (cm) 3.5 Width: (cm) 2 Depth: (cm) 0.2 Area: (cm) 5.498 Volume: (cm) 1.1 % Reduction in Area: -16.7% % Reduction in Volume: -16.8% Epithelialization: None Tunneling: No Undermining: No Wound Description Classification: Unstageable/Unclassified Wound Margin: Distinct, outline attached Exudate Amount: Large Exudate Type: Serosanguineous Exudate Color: red, brown Foul Odor After  Cleansing: No Wound Bed Granulation Amount: Small (1-33%) Exposed Structure Granulation Quality: Pink, Pale Fascia Exposed: No Necrotic Amount: Large (67-100%) Fat Layer Exposed: No Necrotic Quality: Adherent Slough Tendon Exposed: No Muscle Exposed: No Joint Exposed: No Bone Exposed: No Limited to Skin Breakdown Periwound Skin Texture Texture Color No Abnormalities Noted: No No Abnormalities Noted: No WYNETTA, SEITH ([id number]) Callus: No Atrophie Blanche: No Crepitus: No Cyanosis: No Excoriation: No Ecchymosis: No Fluctuance: No Erythema: No Friable: No Hemosiderin Staining: No Induration: No Mottled: No Localized Edema: Yes Pallor: No Rash: No Rubor: No Scarring: No Temperature / Pain Moisture Temperature: No Abnormality No Abnormalities Noted: No Tenderness on Palpation: Yes Dry / Scaly: No Maceration: Yes Moist: Yes Wound Preparation Ulcer Cleansing: Rinsed/Irrigated with Saline  Topical Anesthetic Applied: Other: lidocaine 4%, Treatment Notes Wound #1 (Left, Lateral Foot) 1. Cleansed with: Clean wound with Normal Saline 3. Peri-wound Care: Skin Prep 4. Dressing Applied: Santyl Ointment 5. Secondary Dressing Applied Bordered Foam Dressing Dry Gauze 6. Footwear/Offloading device applied Other footwear/offloading device applied (specify in notes) Notes sage bootS Electronic Signature(s) Signed: 09/08/2015 1:41:16 PM By: Elpidio Eric BSN, RN Entered By: Elpidio Eric on 09/08/2015 12:52:09 Madeline Roup ([id number]) -------------------------------------------------------------------------------- Wound Assessment Details Patient Name: VERINA, GALENO 09/08/2015 12:45 Date of Service: PM Medical Record [id number] Number: Patient Account Number: [id number] Date of Birth/Sex: 1920/05/02 (82 y.o. Female) Afful, RN, BSN, Treating RN: [place] [facility] [place], [place] Physician: BETH Other Clinician: Odelia Gage Treating Referring Physician:  Ardath Sax BETH Physician/Extender: Tania Ade in Treatment: 6 Wound Status Wound Number: 2 Primary Pressure Ulcer Etiology: Wound Location: Left Malleolus - Lateral Wound Open Wounding Event: Pressure Injury Status: Date Acquired: 05/10/2015 Comorbid Cataracts, Asthma, Arrhythmia, History Weeks Of Treatment: 6 History: of pressure wounds, Osteoarthritis, Clustered Wound: No Dementia Wound Measurements Length: (cm) 2.7 Width: (cm) 1.7 Depth: (cm) 0.2 Area: (cm) 3.605 Volume: (cm) 0.721 % Reduction in Area: 8.2% % Reduction in Volume: 8.2% Epithelialization: None Tunneling: No Undermining: No Wound Description Classification: Unstageable/Unclassified Wound Margin: Distinct, outline attached Exudate Amount: Medium Exudate Type: Serosanguineous Exudate Color: red, brown Foul Odor After Cleansing: No Wound Bed Granulation Amount: Small (1-33%) Exposed Structure Granulation Quality: Red, Pink Fascia Exposed: No Necrotic Amount: Large (67-100%) Fat Layer Exposed: No Necrotic Quality: Adherent Slough Tendon Exposed: No Muscle Exposed: No Joint Exposed: No Bone Exposed: No Limited to Skin Breakdown Periwound Skin Texture Texture Color No Abnormalities Noted: No No Abnormalities Noted: No LAIYA, WISBY ([id number]) Callus: No Atrophie Blanche: No Crepitus: No Cyanosis: No Excoriation: No Ecchymosis: No Fluctuance: No Erythema: No Friable: No Hemosiderin Staining: No Induration: No Mottled: No Localized Edema: Yes Pallor: No Rash: No Rubor: No Scarring: No Temperature / Pain Moisture Temperature: No Abnormality No Abnormalities Noted: No Tenderness on Palpation: Yes Dry / Scaly: No Maceration: Yes Moist: Yes Wound Preparation Ulcer Cleansing: Rinsed/Irrigated with Saline Topical Anesthetic Applied: Other: lidocaine 4%, Treatment Notes Wound #2 (Left, Lateral Malleolus) 1. Cleansed with: Clean wound with Normal Saline 3. Peri-wound Care: Skin  Prep 4. Dressing Applied: Santyl Ointment 5. Secondary Dressing Applied Bordered Foam Dressing Dry Gauze 6. Footwear/Offloading device applied Other footwear/offloading device applied (specify in notes) Notes sage bootS Electronic Signature(s) Signed: 09/08/2015 1:41:16 PM By: Elpidio Eric BSN, RN Entered By: Elpidio Eric on 09/08/2015 12:52:20 Madeline Roup ([id number]) -------------------------------------------------------------------------------- Vitals Details Patient Name: ROBECCA, FULGHAM 09/08/2015 12:45 Date of Service: PM Medical Record [id number] Number: Patient Account Number: [id number] Date of Birth/Sex: 10/28/1919 (82 y.o. Female) Afful, RN, BSN, Treating RN: [place] [facility] [place], [place] Physician: BETH Other Clinician: Odelia Gage Treating Referring Physician: Ardath Sax BETH Physician/Extender: Tania Ade in Treatment: 6 Vital Signs Time Taken: 12:45 Pulse (bpm): 68 Height (in): 59 Respiratory Rate (breaths/min): 16 Blood Pressure (mmHg): 104/38 Reference Range: 80 - 120 mg / dl Electronic Signature(s) Signed: 09/08/2015 1:41:16 PM By: Elpidio Eric BSN, RN Entered By: Elpidio Eric on 09/08/2015 12:45:37

## 2015-09-08 NOTE — Progress Notes (Addendum)
Madeline, Cruz ([id number]) Visit Report for 09/08/2015 Chief Complaint Document Details Patient Name: Madeline Cruz, Madeline Cruz 09/08/2015 12:45 Date of Service: PM Medical Record [id number] Number: Patient Account Number: [id number] Date of Birth/Sex: 1920-03-15 (82 y.o. Female) Afful, RN, BSN, Treating RN: Eunice Sink Primary Care [place], [place] Physician: BETH Other Clinician: Odelia Gage Treating Referring Physician: Ardath Sax BETH Physician/Extender: Tania Ade in Treatment: 6 Information Obtained from: Patient Chief Complaint Patient is at the clinic for treatment of an open pressure ulcer with a arterial competent and this has been there on the left foot since the last 3 months Electronic Signature(s) Signed: 09/08/2015 1:15:58 PM By: Ardath Sax MD Previous Signature: 09/08/2015 11:41:03 AM Version By: Ardath Sax MD Entered By: Ardath Sax on 09/08/2015 13:15:58 [name] ([id number]) -------------------------------------------------------------------------------- Debridement Details Patient Name: Madeline, Cruz 09/08/2015 12:45 Date of Service: PM Medical Record [id number] Number: Patient Account Number: [id number] Date of Birth/Sex: 1920/03/25 (82 y.o. Female) Afful, RN, BSN, Treating RN: Melfa Sink Primary Care [place], [place] Physician: BETH Other Clinician: Odelia Gage Treating Referring Physician: Ardath Sax BETH Physician/Extender: Tania Ade in Treatment: 6 Debridement Performed for Wound #2 Left,Lateral Malleolus Assessment: Performed By: Physician Ardath Sax, MD Debridement: Debridement Pre-procedure Yes Verification/Time Out Taken: Start Time: 13:02 Pain Control: Lidocaine 4% Topical Solution Level: Skin/Subcutaneous Tissue Total Area Debrided (L x 2.7 (cm) x 1.7 (cm) = 4.59 (cm) W): Tissue and other Fibrin/Slough, Subcutaneous material debrided: Instrument: Blade, Forceps Bleeding: Minimum Hemostasis Achieved: Pressure End Time:  13:05 Procedural Pain: 0 Post Procedural Pain: 0 Response to Treatment: Procedure was tolerated well Post Debridement Measurements of Total Wound Length: (cm) 2.7 Stage: Category/Stage III Width: (cm) 1.7 Depth: (cm) 0.2 Volume: (cm) 0.721 Post Procedure Diagnosis Same as Pre-procedure Electronic Signature(s) Signed: 09/08/2015 1:41:16 PM By: Elpidio Eric BSN, RN Signed: 09/11/2015 3:38:49 PM By: Ardath Sax MD Entered By: Elpidio Eric on 09/08/2015 13:06:43 [name] ([id number]) [name] ([id number]) -------------------------------------------------------------------------------- HPI Details Patient Name: Madeline, Cruz 09/08/2015 12:45 Date of Service: PM Medical Record [id number] Number: Patient Account Number: [id number] Date of Birth/Sex: March 16, 1920 (82 y.o. Female) Afful, RN, BSN, Treating RN: [place] Sink Primary Care [place], [place] Physician: BETH Other Clinician: Odelia Gage Treating Referring Physician: Ardath Sax BETH Physician/Extender: Tania Ade in Treatment: 6 History of Present Illness Location: left lateral foot Quality: Patient reports experiencing a sharp pain to affected area(s). Severity: Patient states wound are getting worse. Duration: Patient has had the wound for > 3 months prior to seeking treatment at the wound center Timing: Pain in wound is Intermittent (comes and goes Context: The wound appeared gradually over time Modifying Factors: Other treatment(s) tried include:local care and offloading Associated Signs and Symptoms: Patient reports having difficulty standing for long periods. HPI Description: 82 year old patient who has history of advanced dementia and has been living at a nursing home for a long while. She was a smoker in the past and quit smoking at the age of 82 and has a past medical history of advanced dementia, hyponatremia, urinary tract infection, failure to thrive, macular degeneration and glaucoma. She is also  status post appendectomy. 08/04/2015 -- had a left foot x-ray which shows no acute fracture or dislocation. There is no suspicious periostitis or cortical destruction Electronic Signature(s) Signed: 09/08/2015 1:16:25 PM By: Ardath Sax MD Previous Signature: 09/08/2015 11:41:32 AM Version By: Ardath Sax MD Entered By: Ardath Sax on 09/08/2015 13:16:25 [name] ([id number]) -------------------------------------------------------------------------------- Physical Exam Details Patient Name: Madeline, Cruz 09/08/2015 12:45 Date of Service: PM Medical Record [id number] Number: Patient Account Number: [id number] Date of Birth/Sex: [birth year] (  82 y.o. Female) Afful, RN, BSN, Treating RN: [place] Sink Primary Care [place], [place] Physician: BETH Other Clinician: Odelia Gage Treating Referring Physician: Ardath Sax BETH Physician/Extender: Tania Ade in Treatment: 6 Electronic Signature(s) Signed: 09/08/2015 1:16:38 PM By: Ardath Sax MD Previous Signature: 09/08/2015 11:41:39 AM Version By: Ardath Sax MD Entered By: Ardath Sax on 09/08/2015 13:16:38 [name] ([id number]) -------------------------------------------------------------------------------- Physician Orders Details Patient Name: Madeline, Cruz 09/08/2015 12:45 Date of Service: PM Medical Record [id number] Number: Patient Account Number: [id number] Date of Birth/Sex: 1919/05/31 (82 y.o. Female) Afful, RN, BSN, Treating RN: [place] Sink Primary Care [place], [place] Physician: BETH Other Clinician: Odelia Gage Treating Referring Physician: Ardath Sax BETH Physician/Extender: Tania Ade in Treatment: 6 Verbal / Phone Orders: Yes Clinician: Afful, RN, BSN, Rita Read Back and Verified: Yes Diagnosis Coding ICD-10 Coding Code Description [phone] Pressure ulcer of left ankle, stage 3 G30.1 Alzheimer's disease with late onset Z87.891 Personal history of nicotine dependence I70.245 Atherosclerosis of  native arteries of left leg with ulceration of other part of foot Wound Cleansing Wound #1 Left,Lateral Foot o Clean wound with Normal Saline. Wound #2 Left,Lateral Malleolus o Clean wound with Normal Saline. Anesthetic Wound #1 Left,Lateral Foot o Topical Lidocaine 4% cream applied to wound bed prior to debridement Wound #2 Left,Lateral Malleolus o Topical Lidocaine 4% cream applied to wound bed prior to debridement Skin Barriers/Peri-Wound Care Wound #1 Left,Lateral Foot o Skin Prep Wound #2 Left,Lateral Malleolus o Skin Prep Primary Wound Dressing Wound #1 Left,Lateral Foot o Santyl Ointment Wound #2 Left,Lateral Malleolus Hebner, Mackynzie ([id number]) o Santyl Ointment Secondary Dressing Wound #1 Left,Lateral Foot o Boardered Foam Dressing Wound #2 Left,Lateral Malleolus o Boardered Foam Dressing Dressing Change Frequency Wound #1 Left,Lateral Foot o Change dressing every day. Wound #2 Left,Lateral Malleolus o Change dressing every day. Follow-up Appointments Wound #1 Left,Lateral Foot o Return Appointment in 1 week. Wound #2 Left,Lateral Malleolus o Return Appointment in 1 week. Off-Loading Wound #1 Left,Lateral Foot o Heel suspension boot to: - SAGE BOOTS Wound #2 Left,Lateral Malleolus o Heel suspension boot to: - SAGE BOOTS Additional Orders / Instructions Wound #1 Left,Lateral Foot o Increase protein intake. o Other: - ZInc oxide. MVI, Vitamin C Wound #2 Left,Lateral Malleolus o Increase protein intake. - ZInc oxide. MVI, Vitamin C Home Health Wound #1 Left,Lateral Foot o Continue Home Health Visits - [facility] o Home Health Nurse may visit PRN to address patientos wound care needs. o FACE TO FACE ENCOUNTER: MEDICARE and MEDICAID PATIENTS: I certify that this patient is under my care and that I had a face-to-face encounter that meets the physician face-to-face encounter  requirements with this patient on this date. The encounter with the patient was in whole or in part for the following MEDICAL CONDITION: (primary reason for Home Healthcare) MEDICAL NECESSITY: I certify, that based on my findings, NURSING services are a medically SEVILLE, BRICK ([id number]) necessary home health service. HOME BOUND STATUS: I certify that my clinical findings support that this patient is homebound (i.e., Due to illness or injury, pt requires aid of supportive devices such as crutches, cane, wheelchairs, walkers, the use of special transportation or the assistance of another person to leave their place of residence. There is a normal inability to leave the home and doing so requires considerable and taxing effort. Other absences are for medical reasons / religious services and are infrequent or of short duration when for other reasons). o If current dressing causes regression in wound condition, may D/C ordered dressing product/s and apply  Normal Saline Moist Dressing daily until next Wound Healing Center / Other MD appointment. Notify Wound Healing Center of regression in wound condition at [phone]. o Please direct any NON-WOUND related issues/requests for orders to patient's Primary Care Physician Wound #2 Left,Lateral Malleolus o Continue Home Health Visits - [facility] o Home Health Nurse may visit PRN to address patientos wound care needs. o FACE TO FACE ENCOUNTER: MEDICARE and MEDICAID PATIENTS: I certify that this patient is under my care and that I had a face-to-face encounter that meets the physician face-to-face encounter requirements with this patient on this date. The encounter with the patient was in whole or in part for the following MEDICAL CONDITION: (primary reason for Home Healthcare) MEDICAL NECESSITY: I certify, that based on my findings, NURSING services are a medically necessary home health service. HOME BOUND  STATUS: I certify that my clinical findings support that this patient is homebound (i.e., Due to illness or injury, pt requires aid of supportive devices such as crutches, cane, wheelchairs, walkers, the use of special transportation or the assistance of another person to leave their place of residence. There is a normal inability to leave the home and doing so requires considerable and taxing effort. Other absences are for medical reasons / religious services and are infrequent or of short duration when for other reasons). o If current dressing causes regression in wound condition, may D/C ordered dressing product/s and apply Normal Saline Moist Dressing daily until next Wound Healing Center / Other MD appointment. Notify Wound Healing Center of regression in wound condition at [phone]. o Please direct any NON-WOUND related issues/requests for orders to patient's Primary Care Physician Medications-please add to medication list. Wound #1 Left,Lateral Foot o Santyl Enzymatic Ointment o Other: - ZINC, ViTAMIN C, MVI Wound #2 Left,Lateral Malleolus o Santyl Enzymatic Ointment o Other: - ZINC, ViTAMIN C, MVI Electronic Signature(s) Signed: 09/08/2015 1:12:47 PM By: Elpidio Eric BSN, RN Signed: 09/11/2015 3:38:49 PM By: Ardath Sax MD Entered By: Elpidio Eric on 09/08/2015 13:12:47 [name] ([id number]) JENYFER, TRAWICK ([id number]) -------------------------------------------------------------------------------- Prescription 09/08/2015 Patient Name: [name] Physician: Ardath Sax MD Date of Birth: [birth year] NPI#: [id number] Sex: F DEA#: Phone #: [phone] License #: Patient Address: [facility] ATTN [place] [address] [facility] [place], [place] [address] [address] [place], [place] [address] [id number] Allergies sulfa Antibiotics Physician's Orders Santyl  Enzymatic Ointment Signature(s): Date(s): Electronic Signature(s) Signed: 09/08/2015 1:20:08 PM By: Ardath Sax MD Entered By: Ardath Sax on 09/08/2015 13:20:08 [name] ([id number]) --------------------------------------------------------------------------------  Problem List Details Patient Name: ODEAN, MCELWAIN 09/08/2015 12:45 Date of Service: PM Medical Record [id number] Number: Patient Account Number: [id number] Date of Birth/Sex: [birth year] (82 y.o. Female) Afful, RN, BSN, Treating RN:  Sink Primary Care [place], [place] Physician: BETH Other Clinician: Odelia Gage Treating Referring Physician: Ardath Sax BETH Physician/Extender: Tania Ade in Treatment: 6 Active Problems ICD-10 Encounter Code Description Active Date Diagnosis L89.523 Pressure ulcer of left ankle, stage 3 07/28/2015 Yes G30.1 Alzheimer's disease with late onset 07/28/2015 Yes Z87.891 Personal history of nicotine dependence 07/28/2015 Yes I70.245 Atherosclerosis of native arteries of left leg with ulceration 07/28/2015 Yes of other part of foot Inactive Problems Resolved Problems Electronic Signature(s) Signed: 09/08/2015 1:15:45 PM By: Ardath Sax MD Previous Signature: 09/08/2015 11:40:53 AM Version By: Ardath Sax MD Entered By: Ardath Sax on 09/08/2015 13:15:45 [name] ([id number]) -------------------------------------------------------------------------------- Progress Note Details Patient Name: ZANOVIA, ROTZ 09/08/2015 12:45 Date of Service:  PM Medical Record [id number] Number: Patient Account Number: [id number] Date of Birth/Sex: [birth year] (82 y.o. Female) Afful, RN, BSN, Treating RN: [place] Sink Primary Care [place], [place] Physician: BETH Other Clinician: Odelia Gage Treating Referring Physician: Ardath Sax BETH Physician/Extender: Tania Ade in Treatment: 6 Subjective Chief Complaint Information obtained from Patient Patient is at the clinic for treatment  of an open pressure ulcer with a arterial competent and this has been there on the left foot since the last 3 months History of Present Illness (HPI) The following HPI elements were documented for the patient's wound: Location: left lateral foot Quality: Patient reports experiencing a sharp pain to affected area(s). Severity: Patient states wound are getting worse. Duration: Patient has had the wound for > 3 months prior to seeking treatment at the wound center Timing: Pain in wound is Intermittent (comes and goes Context: The wound appeared gradually over time Modifying Factors: Other treatment(s) tried include:local care and offloading Associated Signs and Symptoms: Patient reports having difficulty standing for long periods. 82 year old patient who has history of advanced dementia and has been living at a nursing home for a long while. She was a smoker in the past and quit smoking at the age of 10 and has a past medical history of advanced dementia, hyponatremia, urinary tract infection, failure to thrive, macular degeneration and glaucoma. She is also status post appendectomy. 08/04/2015 -- had a left foot x-ray which shows no acute fracture or dislocation. There is no suspicious periostitis or cortical destruction Objective Constitutional Vitals Time Taken: 12:45 PM, Height: 59 in, Pulse: 68 bpm, Respiratory Rate: 16 breaths/min, Blood Pressure: 104/38 mmHg. SOUNDRA, LAMPLEY ([id number]) Integumentary (Hair, Skin) Wound #1 status is Open. Original cause of wound was Pressure Injury. The wound is located on the Left,Lateral Foot. The wound measures 3.5cm length x 2cm width x 0.2cm depth; 5.498cm^2 area and 1.1cm^3 volume. The wound is limited to skin breakdown. There is no tunneling or undermining noted. There is a large amount of serosanguineous drainage noted. The wound margin is distinct with the outline attached to the wound base. There is small (1-33%) pink, pale granulation within  the wound bed. There is a large (67-100%) amount of necrotic tissue within the wound bed including Adherent Slough. The periwound skin appearance exhibited: Localized Edema, Maceration, Moist. The periwound skin appearance did not exhibit: Callus, Crepitus, Excoriation, Fluctuance, Friable, Induration, Rash, Scarring, Dry/Scaly, Atrophie Blanche, Cyanosis, Ecchymosis, Hemosiderin Staining, Mottled, Pallor, Rubor, Erythema. Periwound temperature was noted as No Abnormality. The periwound has tenderness on palpation. Wound #2 status is Open. Original cause of wound was Pressure Injury. The wound is located on the Left,Lateral Malleolus. The wound measures 2.7cm length x 1.7cm width x 0.2cm depth; 3.605cm^2 area and 0.721cm^3 volume. The wound is limited to skin breakdown. There is no tunneling or undermining noted. There is a medium amount of serosanguineous drainage noted. The wound margin is distinct with the outline attached to the wound base. There is small (1-33%) red, pink granulation within the wound bed. There is a large (67-100%) amount of necrotic tissue within the wound bed including Adherent Slough. The periwound skin appearance exhibited: Localized Edema, Maceration, Moist. The periwound skin appearance did not exhibit: Callus, Crepitus, Excoriation, Fluctuance, Friable, Induration, Rash, Scarring, Dry/Scaly, Atrophie Blanche, Cyanosis, Ecchymosis, Hemosiderin Staining, Mottled, Pallor, Rubor, Erythema. Periwound temperature was noted as No Abnormality. The periwound has tenderness on palpation. Assessment Active Problems ICD-10 L89.523 - Pressure ulcer of left ankle, stage 3 G30.1 - Alzheimer's disease with late onset Z87.891 - Personal  history of nicotine dependence I70.245 - Atherosclerosis of native arteries of left leg with ulceration of other part of foot Procedures Wound #2 Wound #2 is a Pressure Ulcer located on the Left,Lateral Malleolus . There was a  Skin/Subcutaneous Tissue Debridement ([id number]) debridement with total area of 4.59 sq cm performed by Ardath Sax, MD. with the following instrument(s): Blade and Forceps including Fibrin/Slough and Subcutaneous after achieving pain control using Lidocaine 4% Topical Solution. A time out was conducted prior to the start KAITHLYN, TEAGLE ([id number]) of the procedure. A Minimum amount of bleeding was controlled with Pressure. The procedure was tolerated well with a pain level of 0 throughout and a pain level of 0 following the procedure. Post Debridement Measurements: 2.7cm length x 1.7cm width x 0.2cm depth; 0.721cm^3 volume. Post debridement Stage noted as Category/Stage III. Post procedure Diagnosis Wound #2: Same as Pre-Procedure Debrided necrotic material from 2 pressure wounds left ankle. Will continue to treat at nursing home with Santyl dressings. Continue to off load. Plan Wound Cleansing: Wound #1 Left,Lateral Foot: Clean wound with Normal Saline. Wound #2 Left,Lateral Malleolus: Clean wound with Normal Saline. Anesthetic: Wound #1 Left,Lateral Foot: Topical Lidocaine 4% cream applied to wound bed prior to debridement Wound #2 Left,Lateral Malleolus: Topical Lidocaine 4% cream applied to wound bed prior to debridement Skin Barriers/Peri-Wound Care: Wound #1 Left,Lateral Foot: Skin Prep Wound #2 Left,Lateral Malleolus: Skin Prep Primary Wound Dressing: Wound #1 Left,Lateral Foot: Santyl Ointment Wound #2 Left,Lateral Malleolus: Santyl Ointment Secondary Dressing: Wound #1 Left,Lateral Foot: Boardered Foam Dressing Wound #2 Left,Lateral Malleolus: Boardered Foam Dressing Dressing Change Frequency: Wound #1 Left,Lateral Foot: Change dressing every day. Wound #2 Left,Lateral Malleolus: Change dressing every day. Follow-up Appointments: Wound #1 Left,Lateral Foot: Return Appointment in 1 week. TAHNI, PORCHIA ([id number]) Wound #2 Left,Lateral Malleolus: Return  Appointment in 1 week. Off-Loading: Wound #1 Left,Lateral Foot: Heel suspension boot to: - SAGE BOOTS Wound #2 Left,Lateral Malleolus: Heel suspension boot to: - SAGE BOOTS Additional Orders / Instructions: Wound #1 Left,Lateral Foot: Increase protein intake. Other: - ZInc oxide. MVI, Vitamin C Wound #2 Left,Lateral Malleolus: Increase protein intake. - ZInc oxide. MVI, Vitamin C Home Health: Wound #1 Left,Lateral Foot: Continue Home Health Visits - [facility] Home Health Nurse may visit PRN to address patient s wound care needs. FACE TO FACE ENCOUNTER: MEDICARE and MEDICAID PATIENTS: I certify that this patient is under my care and that I had a face-to-face encounter that meets the physician face-to-face encounter requirements with this patient on this date. The encounter with the patient was in whole or in part for the following MEDICAL CONDITION: (primary reason for Home Healthcare) MEDICAL NECESSITY: I certify, that based on my findings, NURSING services are a medically necessary home health service. HOME BOUND STATUS: I certify that my clinical findings support that this patient is homebound (i.e., Due to illness or injury, pt requires aid of supportive devices such as crutches, cane, wheelchairs, walkers, the use of special transportation or the assistance of another person to leave their place of residence. There is a normal inability to leave the home and doing so requires considerable and taxing effort. Other absences are for medical reasons / religious services and are infrequent or of short duration when for other reasons). If current dressing causes regression in wound condition, may D/C ordered dressing product/s and apply Normal Saline Moist Dressing daily until next Wound Healing Center / Other MD appointment. Notify Wound Healing Center of regression in wound condition at [phone].  Please direct any NON-WOUND related issues/requests for  orders to patient's Primary Care Physician Wound #2 Left,Lateral Malleolus: Continue Home Health Visits - [facility] Home Health Nurse may visit PRN to address patient s wound care needs. FACE TO FACE ENCOUNTER: MEDICARE and MEDICAID PATIENTS: I certify that this patient is under my care and that I had a face-to-face encounter that meets the physician face-to-face encounter requirements with this patient on this date. The encounter with the patient was in whole or in part for the following MEDICAL CONDITION: (primary reason for Home Healthcare) MEDICAL NECESSITY: I certify, that based on my findings, NURSING services are a medically necessary home health service. HOME BOUND STATUS: I certify that my clinical findings support that this patient is homebound (i.e., Due to illness or injury, pt requires aid of supportive devices such as crutches, cane, wheelchairs, walkers, the use of special transportation or the assistance of another person to leave their place of residence. There is a normal inability to leave the home and doing so requires considerable and taxing effort. Other absences are for medical reasons / religious services and are infrequent or of short duration when for other reasons). If current dressing causes regression in wound condition, may D/C ordered dressing product/s and apply Normal Saline Moist Dressing daily until next Wound Healing Center / Other MD appointment. Notify Wound Healing Center of regression in wound condition at [phone]. Please direct any NON-WOUND related issues/requests for orders to patient's Primary Care Physician Medications-please add to medication list.: Wound #1 Left,Lateral Foot: Santyl Enzymatic Ointment [name], Brinley ([id number]) Other: - ZINC, ViTAMIN C, MVI Wound #2 Left,Lateral Malleolus: Santyl Enzymatic Ointment Other: - ZINC, ViTAMIN C, MVI Follow-Up Appointments: A Patient Clinical Summary of Care was  provided to LT Electronic Signature(s) Signed: 09/08/2015 1:19:35 PM By: Ardath SaxParker, Marga Gramajo MD Entered By: Ardath SaxParker, Pamela Intrieri on 09/08/2015 13:19:35 [name], Orlinda ([id number]) -------------------------------------------------------------------------------- SuperBill Details Patient Name: [name], Ardythe Date of Service: 09/08/2015 Medical Record Patient Account Number: [id number] Number: Afful, RN, BSN, Treating RN: Date of Birth/Sex: September 16, 1919 (82 y.o. Female) [facility] [place] Other Clinician: Physician: BETH Treating Thana FarrPARKER, Yannet Rincon MCGRANAGHAN, MARY Physician/Extender: Referring Physician: Derald MacleodBETH Weeks in Treatment: 6 Diagnosis Coding ICD-10 Codes Code Description [phone] Pressure ulcer of left ankle, stage 3 G30.1 Alzheimer's disease with late onset Z87.891 Personal history of nicotine dependence I70.245 Atherosclerosis of native arteries of left leg with ulceration of other part of foot Facility Procedures CPT4: Description Modifier Quantity Code [id number] 11042 - DEB SUBQ TISSUE 20 SQ CM/< 1 ICD-10 Description Diagnosis I70.245 Atherosclerosis of native arteries of left leg with ulceration of other part of foot Physician Procedures CPT4: Description Modifier Quantity Code [id number] 99213 - WC PHYS LEVEL 3 - EST PT 1 ICD-10 Description Diagnosis L89.523 Pressure ulcer of left ankle, stage 3 CPT4: 10272536770168 11042 - WC PHYS SUBQ TISS 20 SQ CM 1 ICD-10 Description Diagnosis I70.245 Atherosclerosis of native arteries of left leg with ulceration of other part of foot Electronic Signature(s) Signed: 09/08/2015 1:20:04 PM By: Ardath SaxParker, Jocilynn Grade MD [name], Asjia ([id number]) Entered By: Ardath SaxParker, Zadiel Leyh on 09/08/2015 13:20:03

## 2015-09-15 ENCOUNTER — Encounter: Payer: Medicare Other | Admitting: Surgery

## 2015-09-15 DIAGNOSIS — I70245 Atherosclerosis of native arteries of left leg with ulceration of other part of foot: Secondary | ICD-10-CM | POA: Diagnosis not present

## 2015-09-15 NOTE — Progress Notes (Addendum)
NIOKA, THORINGTON ([id number]) Visit Report for 09/15/2015 Arrival Information Details Patient Name: Madeline Cruz, Madeline Cruz 09/15/2015 12:45 Date of Service: PM Medical Record [id number] Number: Patient Account Number: [id number] Date of Birth/Sex: 1919-11-16 (82 y.o. Female) Afful, RN, BSN, Treating RN: [place] [facility] [place], [place] Physician: BETH Other Clinician: Odelia Gage Treating Referring Physician: Lehman Prom Physician/Extender: Tania Ade in Treatment: 7 Visit Information History Since Last Visit Added or deleted any medications: No Patient Arrived: Wheel Chair Any new allergies or adverse reactions: No Arrival Time: 13:00 Had a fall or experienced change in No activities of daily living that may affect Accompanied By: dtr risk of falls: Transfer Assistance: None Signs or symptoms of abuse/neglect since last No Patient Identification Verified: Yes visito Secondary Verification Process Yes Hospitalized since last visit: No Completed: Has Dressing in Place as Prescribed: Yes Patient Requires Transmission-Based No Pain Present Now: No Precautions: Patient Has Alerts: No Electronic Signature(s) Signed: 09/15/2015 1:01:06 PM By: Elpidio Eric BSN, RN Entered By: Elpidio Eric on 09/15/2015 13:01:06 Madeline Roup ([id number]) -------------------------------------------------------------------------------- Encounter Discharge Information Details Patient Name: Madeline Cruz, Madeline Cruz 09/15/2015 12:45 Date of Service: PM Medical Record [id number] Number: Patient Account Number: [id number] Date of Birth/Sex: [birth year] (82 y.o. Female) Afful, RN, BSN, Treating RN: [place] [facility] [place], [place] Physician: BETH Other Clinician: Odelia Gage Treating Referring Physician: Lehman Prom Physician/Extender: Tania Ade in Treatment: 7 Encounter Discharge Information Items Discharge Pain Level: 0 Discharge Condition: Stable Ambulatory Status: Wheelchair Discharge  Destination: Nursing Home Transportation: Other Accompanied By: dtr Schedule Follow-up Appointment: No Medication Reconciliation completed and provided to Patient/Care No Kaziyah Parkison: Provided on Clinical Summary of Care: 09/15/2015 Form Type Recipient Paper Patient LT Electronic Signature(s) Signed: 09/15/2015 3:41:40 PM By: Elpidio Eric BSN, RN Previous Signature: 09/15/2015 1:40:39 PM Version By: Gwenlyn Perking Entered By: Elpidio Eric on 09/15/2015 15:41:40 Madeline Roup ([id number]) -------------------------------------------------------------------------------- Lower Extremity Assessment Details Patient Name: Madeline Cruz, Madeline Cruz 09/15/2015 12:45 Date of Service: PM Medical Record [id number] Number: Patient Account Number: [id number] Date of Birth/Sex: 1919-10-19 (82 y.o. Female) Afful, RN, BSN, Treating RN: [place] [facility] [place], [place] Physician: BETH Other Clinician: Odelia Gage Treating Referring Physician: Evlyn Kanner BETH Physician/Extender: Tania Ade in Treatment: 7 Vascular Assessment Pulses: Posterior Tibial Dorsalis Pedis Doppler: [Left:Monophasic] Extremity colors, hair growth, and conditions: Extremity Color: [Left:Mottled] Hair Growth on Extremity: [Left:No] Temperature of Extremity: [Left:Warm] Capillary Refill: [Left:< 3 seconds] Toe Nail Assessment Left: Right: Thick: Yes Discolored: Yes Deformed: No Improper Length and Hygiene: No Electronic Signature(s) Signed: 09/15/2015 1:01:27 PM By: Elpidio Eric BSN, RN Entered By: Elpidio Eric on 09/15/2015 13:01:27 Madeline Roup ([id number]) -------------------------------------------------------------------------------- Multi Wound Chart Details Patient Name: Madeline Cruz, Madeline Cruz 09/15/2015 12:45 Date of Service: PM Medical Record [id number] Number: Patient Account Number: [id number] Date of Birth/Sex: September 08, 1919 (82 y.o. Female) Afful, RN, BSN, Treating RN: [place] [facility] Primary [facility] [place],  [place] Physician: BETH Other Clinician: Odelia Gage Treating Referring Physician: Lehman Prom Physician/Extender: Tania Ade in Treatment: 7 Vital Signs Height(in): 59 Pulse(bpm): 56 Weight(lbs): Blood Pressure 174/143 (mmHg): Body Mass Index(BMI): Temperature(F): Respiratory Rate 17 (breaths/min): Photos: [1:No Photos] [2:No Photos] [3:No Photos] Wound Location: [1:Left Foot - Lateral] [2:Left Malleolus - Lateral] [3:Right Malleolus - Medial] Wounding Event: [1:Pressure Injury] [2:Pressure Injury] [3:Gradually Appeared] Primary Etiology: [1:Pressure Ulcer] [2:Pressure Ulcer] [3:Pressure Ulcer] Comorbid History: [1:Cataracts, Asthma, Arrhythmia, History of pressure wounds, Osteoarthritis, Dementia] [2:Cataracts, Asthma, Arrhythmia, History of pressure wounds, Osteoarthritis, Dementia] [3:Cataracts, Asthma, Arrhythmia, History of pressure  wounds, Osteoarthritis, Dementia] Date Acquired: [1:05/10/2015] [2:05/10/2015] [3:09/11/2015] Weeks of Treatment: [1:7] [2:7] [3:0] Wound Status: [1:Open] [2:Open] [3:Open] Measurements  L x W x D 3.2x2x0.2 [2:4x2.4x0.2] [3:1x1x0.3] (cm) Area (cm) : [1:5.027] [2:7.54] [3:0.785] Volume (cm) : [1:1.005] [2:1.508] [3:0.236] % Reduction in Area: [1:-6.70%] [2:-92.00%] [3:N/A] % Reduction in Volume: -6.70% [2:-92.10%] [3:N/A] Classification: [1:Unstageable/Unclassified] [2:Unstageable/Unclassified] [3:Category/Stage III] Exudate Amount: [1:Large] [2:Large] [3:Large] Exudate Type: [1:Serosanguineous] [2:Serosanguineous] [3:Serosanguineous] Exudate Color: [1:red, brown] [2:red, brown] [3:red, brown] Wound Margin: [1:Distinct, outline attached] [2:Distinct, outline attached] [3:Distinct, outline attached] Granulation Amount: [1:Small (1-33%)] [2:Small (1-33%)] [3:None Present (0%)] Granulation Quality: [1:Pink, Pale] [2:Red, Pink] [3:N/A] Necrotic Amount: [1:Large (67-100%)] [2:Large (67-100%)] [3:Large (67-100%)] Necrotic Tissue:  [1:Adherent Slough] [2:Adherent Slough] [3:Eschar, Adherent Slough] Exposed Structures: Madeline Cruz, Madeline Cruz ([id number]) Fascia: No Fascia: No Fascia: No Fat: No Fat: No Fat: No Tendon: No Tendon: No Tendon: No Muscle: No Muscle: No Muscle: No Joint: No Joint: No Joint: No Bone: No Bone: No Bone: No Limited to Skin Limited to Skin Limited to Skin Breakdown Breakdown Breakdown Epithelialization: None None N/A Periwound Skin Texture: Edema: Yes Edema: Yes Edema: No Excoriation: No Excoriation: No Excoriation: No Induration: No Induration: No Induration: No Callus: No Callus: No Callus: No Crepitus: No Crepitus: No Crepitus: No Fluctuance: No Fluctuance: No Fluctuance: No Friable: No Friable: No Friable: No Rash: No Rash: No Rash: No Scarring: No Scarring: No Scarring: No Periwound Skin Maceration: Yes Maceration: Yes Moist: Yes Moisture: Moist: Yes Moist: Yes Maceration: No Dry/Scaly: No Dry/Scaly: No Dry/Scaly: No Periwound Skin Color: Atrophie Blanche: No Atrophie Blanche: No Atrophie Blanche: No Cyanosis: No Cyanosis: No Cyanosis: No Ecchymosis: No Ecchymosis: No Ecchymosis: No Erythema: No Erythema: No Erythema: No Hemosiderin Staining: No Hemosiderin Staining: No Hemosiderin Staining: No Mottled: No Mottled: No Mottled: No Pallor: No Pallor: No Pallor: No Rubor: No Rubor: No Rubor: No Temperature: No Abnormality No Abnormality No Abnormality Tenderness on Yes Yes Yes Palpation: Wound Preparation: Ulcer Cleansing: Ulcer Cleansing: Ulcer Cleansing: Rinsed/Irrigated with Rinsed/Irrigated with Rinsed/Irrigated with Saline Saline Saline Topical Anesthetic Topical Anesthetic Topical Anesthetic Applied: Other: lidocaine Applied: Other: lidocaine Applied: Other: lidocaine 4% 4% 4% Treatment Notes Electronic Signature(s) Signed: 09/15/2015 4:08:52 PM By: Elpidio Eric BSN, RN Entered By: Elpidio Eric on 09/15/2015 13:24:23 Madeline Roup ([id number]) -------------------------------------------------------------------------------- Multi-Disciplinary Care Plan Details Patient Name: Madeline Cruz, Madeline Cruz 09/15/2015 12:45 Date of Service: PM Medical Record [id number] Number: Patient Account Number: [id number] Date of Birth/Sex: February 03, 1920 (82 y.o. Female) Afful, RN, BSN, Treating RN: [place] [facility] Primary Care [place], [place] Physician: BETH Other Clinician: Odelia Gage Treating Referring Physician: Lehman Prom Physician/Extender: Tania Ade in Treatment: 7 Active Inactive Orientation to the Wound Care Program Nursing Diagnoses: Knowledge deficit related to the wound healing center program Goals: Patient/caregiver will verbalize understanding of the Wound Healing Center Program Date Initiated: 07/28/2015 Goal Status: Active Interventions: Provide education on orientation to the wound center Notes: Pressure Nursing Diagnoses: Knowledge deficit related to causes and risk factors for pressure ulcer development Knowledge deficit related to management of pressures ulcers Potential for impaired tissue integrity related to pressure, friction, moisture, and shear Goals: Patient will remain free from development of additional pressure ulcers Date Initiated: 07/28/2015 Goal Status: Active Patient will remain free of pressure ulcers Date Initiated: 07/28/2015 Goal Status: Active Patient/caregiver will verbalize risk factors for pressure ulcer development Date Initiated: 07/28/2015 Goal Status: Active Patient/caregiver will verbalize understanding of pressure ulcer management Date Initiated: 07/28/2015 Madeline Roup ([id number]) Goal Status: Active Interventions: Assess: immobility, friction, shearing, incontinence upon admission and as needed Assess offloading mechanisms upon admission and as needed Assess potential for pressure ulcer upon admission and as needed Provide education on pressure  ulcers Treatment  Activities: Patient referred for home evaluation of offloading devices/mattresses : 09/15/2015 Patient referred for pressure reduction/relief devices : 09/15/2015 Patient referred for seating evaluation to ensure proper offloading : 09/15/2015 Pressure reduction/relief device ordered : 09/15/2015 Notes: Wound/Skin Impairment Nursing Diagnoses: Impaired tissue integrity Knowledge deficit related to smoking impact on wound healing Knowledge deficit related to ulceration/compromised skin integrity Goals: Patient/caregiver will verbalize understanding of skin care regimen Date Initiated: 07/28/2015 Goal Status: Active Ulcer/skin breakdown will have a volume reduction of 30% by week 4 Date Initiated: 07/28/2015 Goal Status: Active Ulcer/skin breakdown will have a volume reduction of 50% by week 8 Date Initiated: 07/28/2015 Goal Status: Active Ulcer/skin breakdown will have a volume reduction of 80% by week 12 Date Initiated: 07/28/2015 Goal Status: Active Ulcer/skin breakdown will heal within 14 weeks Date Initiated: 07/28/2015 Goal Status: Active Interventions: Assess patient/caregiver ability to perform ulcer/skin care regimen upon admission and as needed Assess ulceration(s) every visit Provide education on ulcer and skin care Madeline Cruz, Madeline Cruz ([id number]) Treatment Activities: Skin care regimen initiated : 09/15/2015 Topical wound management initiated : 09/15/2015 Notes: Electronic Signature(s) Signed: 09/15/2015 4:08:52 PM By: Elpidio EricAfful, Rita BSN, RN Entered By: Elpidio EricAfful, Rita on 09/15/2015 13:24:14 Madeline Cruz, Madeline Cruz ([id number]) -------------------------------------------------------------------------------- Pain Assessment Details Patient Name: Madeline Cruz, Madeline Cruz 09/15/2015 12:45 Date of Service: PM Medical Record [id number] Number: Patient Account Number: [id number] Date of Birth/Sex: 1920/01/09 (82 y.o. Female) Afful, RN, BSN, Treating RN: [place] [facility] [place] Physician:  BETH Other Clinician: Odelia GageMCGRANAGHAN, MARY Treating Referring Physician: Lehman PromBritto, Errol BETH Physician/Extender: Tania AdeWeeks in Treatment: 7 Active Problems Location of Pain Severity and Description of Pain Patient Has Paino No Site Locations Pain Management and Medication Current Pain Management: Electronic Signature(s) Signed: 09/15/2015 4:08:52 PM By: Elpidio EricAfful, Rita BSN, RN Entered By: Elpidio EricAfful, Rita on 09/15/2015 13:05:54 Madeline Cruz, Madeline Cruz ([id number]) -------------------------------------------------------------------------------- Patient/Caregiver Education Details Patient Name: Madeline Cruz, Satara 09/15/2015 12:45 Date of Service: PM Medical Record [id number] Number: Patient Account Number: [id number] Date of Birth/Gender: 1920/01/09 (82 y.o. Female) Afful, RN, BSN, Treating RN: [place] [facility] [place] Physician: BETH Other Clinician: Odelia GageMCGRANAGHAN, MARY Treating Referring Physician: Lehman PromBritto, Errol BETH Physician/Extender: Tania AdeWeeks in Treatment: 7 Education Assessment Education Provided To: Caregiver Education Topics Provided Pressure: Methods: Explain/Verbal Responses: State content correctly Welcome To The Wound Care Center: Methods: Explain/Verbal Responses: State content correctly Wound/Skin Impairment: Methods: Explain/Verbal Responses: State content correctly Electronic Signature(s) Signed: 09/15/2015 3:42:05 PM By: Elpidio EricAfful, Rita BSN, RN Entered By: Elpidio EricAfful, Rita on 09/15/2015 15:42:05 Madeline Cruz, Billee ([id number]) -------------------------------------------------------------------------------- Wound Assessment Details Patient Name: Madeline Cruz, Sheryl 09/15/2015 12:45 Date of Service: PM Medical Record [id number] Number: Patient Account Number: [id number] Date of Birth/Sex: 1920/01/09 (82 y.o. Female) Afful, RN, BSN, Treating RN: [place] [facility] [place] Physician: BETH Other Clinician: Odelia GageMCGRANAGHAN, MARY Treating Referring Physician: Evlyn KannerBritto, Errol BETH  Physician/Extender: Tania AdeWeeks in Treatment: 7 Wound Status Wound Number: 1 Primary Pressure Ulcer Etiology: Wound Location: Left Foot - Lateral Wound Open Wounding Event: Pressure Injury Status: Date Acquired: 05/10/2015 Comorbid Cataracts, Asthma, Arrhythmia, History Weeks Of Treatment: 7 History: of pressure wounds, Osteoarthritis, Clustered Wound: No Dementia Photos Photo Uploaded By: Elpidio EricAfful, Rita on 09/15/2015 15:55:09 Wound Measurements Length: (cm) 3.2 Width: (cm) 2 Depth: (cm) 0.2 Area: (cm) 5.027 Volume: (cm) 1.005 % Reduction in Area: -6.7% % Reduction in Volume: -6.7% Epithelialization: None Tunneling: No Undermining: No Wound Description Classification: Unstageable/Unclassified Madeline Cruz, Janeliz ([id number]) Foul Odor After Cleansing: No Wound Margin: Distinct, outline attached Exudate Amount: Large Exudate Type: Serosanguineous Exudate Color: red, brown Wound Bed Granulation Amount: Small (1-33%) Exposed Structure  Granulation Quality: Pink, Pale Fascia Exposed: No Necrotic Amount: Large (67-100%) Fat Layer Exposed: No Necrotic Quality: Adherent Slough Tendon Exposed: No Muscle Exposed: No Joint Exposed: No Bone Exposed: No Limited to Skin Breakdown Periwound Skin Texture Texture Color No Abnormalities Noted: No No Abnormalities Noted: No Callus: No Atrophie Blanche: No Crepitus: No Cyanosis: No Excoriation: No Ecchymosis: No Fluctuance: No Erythema: No Friable: No Hemosiderin Staining: No Induration: No Mottled: No Localized Edema: Yes Pallor: No Rash: No Rubor: No Scarring: No Temperature / Pain Moisture Temperature: No Abnormality No Abnormalities Noted: No Tenderness on Palpation: Yes Dry / Scaly: No Maceration: Yes Moist: Yes Wound Preparation Ulcer Cleansing: Rinsed/Irrigated with Saline Topical Anesthetic Applied: Other: lidocaine 4%, Treatment Notes Wound #1 (Left, Lateral Foot) 1. Cleansed with: Clean wound with Normal  Saline 3. Peri-wound Care: Skin Prep 4. Dressing Applied: Santyl Ointment 5. Secondary Dressing Applied ZAIN, BINGMAN ([id number]) Bordered Foam Dressing Dry Gauze 6. Footwear/Offloading device applied Other footwear/offloading device applied (specify in notes) Notes sage bootS Electronic Signature(s) Signed: 09/15/2015 4:08:52 PM By: Elpidio Eric BSN, RN Entered By: Elpidio Eric on 09/15/2015 13:22:34 Madeline Roup ([id number]) -------------------------------------------------------------------------------- Wound Assessment Details Patient Name: SADEEL, FIDDLER 09/15/2015 12:45 Date of Service: PM Medical Record [id number] Number: Patient Account Number: [id number] Date of Birth/Sex: 01/28/1920 (82 y.o. Female) Afful, RN, BSN, Treating RN: [place] [facility] [place], [place] Physician: BETH Other Clinician: Odelia Gage Treating Referring Physician: Evlyn Kanner BETH Physician/Extender: Tania Ade in Treatment: 7 Wound Status Wound Number: 2 Primary Pressure Ulcer Etiology: Wound Location: Left Malleolus - Lateral Wound Open Wounding Event: Pressure Injury Status: Date Acquired: 05/10/2015 Comorbid Cataracts, Asthma, Arrhythmia, History Weeks Of Treatment: 7 History: of pressure wounds, Osteoarthritis, Clustered Wound: No Dementia Photos Photo Uploaded By: Elpidio Eric on 09/15/2015 15:55:09 Wound Measurements Length: (cm) 4 Width: (cm) 2.4 Depth: (cm) 0.2 Area: (cm) 7.54 Volume: (cm) 1.508 % Reduction in Area: -92% % Reduction in Volume: -92.1% Epithelialization: None Tunneling: No Undermining: No Wound Description Classification: Unstageable/Unclassified LIZA, CZERWINSKI ([id number]) Foul Odor After Cleansing: No Wound Margin: Distinct, outline attached Exudate Amount: Large Exudate Type: Serosanguineous Exudate Color: red, brown Wound Bed Granulation Amount: Small (1-33%) Exposed Structure Granulation Quality: Red, Pink Fascia Exposed:  No Necrotic Amount: Large (67-100%) Fat Layer Exposed: No Necrotic Quality: Adherent Slough Tendon Exposed: No Muscle Exposed: No Joint Exposed: No Bone Exposed: No Limited to Skin Breakdown Periwound Skin Texture Texture Color No Abnormalities Noted: No No Abnormalities Noted: No Callus: No Atrophie Blanche: No Crepitus: No Cyanosis: No Excoriation: No Ecchymosis: No Fluctuance: No Erythema: No Friable: No Hemosiderin Staining: No Induration: No Mottled: No Localized Edema: Yes Pallor: No Rash: No Rubor: No Scarring: No Temperature / Pain Moisture Temperature: No Abnormality No Abnormalities Noted: No Tenderness on Palpation: Yes Dry / Scaly: No Maceration: Yes Moist: Yes Wound Preparation Ulcer Cleansing: Rinsed/Irrigated with Saline Topical Anesthetic Applied: Other: lidocaine 4%, Treatment Notes Wound #2 (Left, Lateral Malleolus) 1. Cleansed with: Clean wound with Normal Saline 3. Peri-wound Care: Skin Prep 4. Dressing Applied: Santyl Ointment 5. Secondary Dressing Applied LESTA, LIMBERT ([id number]) Bordered Foam Dressing Dry Gauze 6. Footwear/Offloading device applied Other footwear/offloading device applied (specify in notes) Notes sage bootS Electronic Signature(s) Signed: 09/15/2015 4:08:52 PM By: Elpidio Eric BSN, RN Entered By: Elpidio Eric on 09/15/2015 13:24:02 Madeline Roup ([id number]) -------------------------------------------------------------------------------- Wound Assessment Details Patient Name: JACKEE, GLASNER 09/15/2015 12:45 Date of Service: PM Medical Record [id number] Number: Patient Account Number: [id number] Date of Birth/Sex: 17-Jan-1920 (82 y.o. Female) Afful, RN, BSN, Treating RN: [place] Sink Primary  Care [place], [place] Physician: BETH Other Clinician: Odelia Gage Treating Referring Physician: Evlyn Kanner BETH Physician/Extender: Tania Ade in Treatment: 7 Wound Status Wound Number: 3 Primary Pressure  Ulcer Etiology: Wound Location: Right Malleolus - Medial Wound Open Wounding Event: Gradually Appeared Status: Date Acquired: 09/11/2015 Comorbid Cataracts, Asthma, Arrhythmia, History Weeks Of Treatment: 0 History: of pressure wounds, Osteoarthritis, Clustered Wound: No Dementia Photos Photo Uploaded By: Elpidio Eric on 09/15/2015 15:55:24 Wound Measurements Length: (cm) 1 Width: (cm) 1 Depth: (cm) 0.3 Area: (cm) 0.785 Volume: (cm) 0.236 % Reduction in Area: % Reduction in Volume: Tunneling: No Undermining: No Wound Description Classification: Category/Stage III BELLARAE, LIZER ([id number]) Foul Odor After Cleansing: No Wound Margin: Distinct, outline attached Exudate Amount: Large Exudate Type: Serosanguineous Exudate Color: red, brown Wound Bed Granulation Amount: None Present (0%) Exposed Structure Necrotic Amount: Large (67-100%) Fascia Exposed: No Necrotic Quality: Eschar, Adherent Slough Fat Layer Exposed: No Tendon Exposed: No Muscle Exposed: No Joint Exposed: No Bone Exposed: No Limited to Skin Breakdown Periwound Skin Texture Texture Color No Abnormalities Noted: No No Abnormalities Noted: No Callus: No Atrophie Blanche: No Crepitus: No Cyanosis: No Excoriation: No Ecchymosis: No Fluctuance: No Erythema: No Friable: No Hemosiderin Staining: No Induration: No Mottled: No Localized Edema: No Pallor: No Rash: No Rubor: No Scarring: No Temperature / Pain Moisture Temperature: No Abnormality No Abnormalities Noted: No Tenderness on Palpation: Yes Dry / Scaly: No Maceration: No Moist: Yes Wound Preparation Ulcer Cleansing: Rinsed/Irrigated with Saline Topical Anesthetic Applied: Other: lidocaine 4%, Electronic Signature(s) Signed: 09/15/2015 4:08:52 PM By: Elpidio Eric BSN, RN Entered By: Elpidio Eric on 09/15/2015 13:18:48 Madeline Roup  ([id number]) -------------------------------------------------------------------------------- Vitals Details Patient Name: TERRILYN, TYNER 09/15/2015 12:45 Date of Service: PM Medical Record [id number] Number: Patient Account Number: [id number] Date of Birth/Sex: [birth year] (82 y.o. Female) Afful, RN, BSN, Treating RN: [place] [facility] Primary Care [place], [place] Physician: BETH Other Clinician: Odelia Gage Treating Referring Physician: Lehman Prom Physician/Extender: Tania Ade in Treatment: 7 Vital Signs Time Taken: 13:15 Pulse (bpm): 56 Height (in): 59 Respiratory Rate (breaths/min): 17 Blood Pressure (mmHg): 174/143 Reference Range: 80 - 120 mg / dl Electronic Signature(s) Signed: 09/15/2015 4:08:52 PM By: Elpidio Eric BSN, RN Entered By: Elpidio Eric on 09/15/2015 13:10:55

## 2015-09-16 NOTE — Progress Notes (Addendum)
Madeline Cruz ([id number]) Visit Report for 09/15/2015 Chief Complaint Document Details Patient Name: Madeline Cruz, Madeline Cruz 09/15/2015 12:45 Date of Service: PM Medical Record [id number] Number: Patient Account Number: [id number] Date of Birth/Sex: 01-Feb-1920 (82 y.o. Female) Afful, RN, BSN, Treating RN: [place] [facility] [place], [place] Physician: BETH Other Clinician: Odelia Gage Treating Referring Physician: Lehman Prom Physician/Extender: Tania Ade in Treatment: 7 Information Obtained from: Patient Chief Complaint Patient is at the clinic for treatment of an open pressure ulcer with a arterial competent and this has been there on the left foot since the last 3 months Electronic Signature(s) Signed: 09/15/2015 1:33:19 PM By: Evlyn Kanner MD, FACS Entered By: Evlyn Kanner on 09/15/2015 13:33:18 Madeline Cruz ([id number]) -------------------------------------------------------------------------------- Debridement Details Patient Name: Madeline Cruz, Madeline Cruz 09/15/2015 12:45 Date of Service: PM Medical Record [id number] Number: Patient Account Number: [id number] Date of Birth/Sex: February 16, 1920 (82 y.o. Female) Afful, RN, BSN, Treating RN: [place] [facility] [place], [place] Physician: BETH Other Clinician: Odelia Gage Treating Referring Physician: Lehman Prom Physician/Extender: Tania Ade in Treatment: 7 Debridement Performed for Wound #1 Left,Lateral Foot Assessment: Performed By: Physician Evlyn Kanner, MD Debridement: Debridement Pre-procedure Yes Verification/Time Out Taken: Start Time: 13:20 Pain Control: Lidocaine 4% Topical Solution Level: Skin/Subcutaneous Tissue Total Area Debrided (L x 3.2 (cm) x 2 (cm) = 6.4 (cm) W): Tissue and other Non-Viable, Exudate, Fibrin/Slough, Subcutaneous material debrided: Instrument: Forceps, Scissors Bleeding: Minimum Hemostasis Achieved: Pressure End Time: 13:25 Procedural Pain: 0 Post Procedural Pain:  0 Response to Treatment: Procedure was tolerated well Post Debridement Measurements of Total Wound Length: (cm) 3.2 Stage: Unstageable/Unclassified Width: (cm) 2 Depth: (cm) 0.2 Volume: (cm) 1.005 Post Procedure Diagnosis Same as Pre-procedure Electronic Signature(s) Signed: 09/15/2015 1:32:57 PM By: Evlyn Kanner MD, FACS Signed: 09/15/2015 4:08:52 PM By: Elpidio Eric BSN, RN Entered By: Evlyn Kanner on 09/15/2015 13:32:57 Madeline Cruz ([name] ([id number]) -------------------------------------------------------------------------------- Debridement Details Patient Name: Madeline Cruz, Madeline Cruz 09/15/2015 12:45 Date of Service: PM Medical Record [id number] Number: Patient Account Number: [id number] Date of Birth/Sex: 1919/09/25 (82 y.o. Female) Afful, RN, BSN, Treating RN: [place] [facility] [place], [place] Physician: BETH Other Clinician: Odelia Gage Treating Referring Physician: Lehman Prom Physician/Extender: Tania Ade in Treatment: 7 Debridement Performed for Wound #2 Left,Lateral Malleolus Assessment: Performed By: Physician Evlyn Kanner, MD Debridement: Debridement Pre-procedure Yes Verification/Time Out Taken: Start Time: 13:15 Pain Control: Lidocaine 4% Topical Solution Level: Skin/Subcutaneous Tissue Total Area Debrided (L x 4 (cm) x 2.4 (cm) = 9.6 (cm) W): Tissue and other Non-Viable, Exudate, Fibrin/Slough, Subcutaneous material debrided: Instrument: Forceps, Scissors Bleeding: Minimum Hemostasis Achieved: Pressure End Time: 13:20 Procedural Pain: 0 Post Procedural Pain: 0 Response to Treatment: Procedure was tolerated well Post Debridement Measurements of Total Wound Length: (cm) 4 Stage: Category/Stage III Width: (cm) 2.4 Depth: (cm) 0.2 Volume: (cm) 1.508 Post Procedure Diagnosis Same as Pre-procedure Electronic Signature(s) Signed: 09/15/2015 1:33:07 PM By: Evlyn Kanner MD, FACS Signed: 09/15/2015 4:08:52 PM By:  Elpidio Eric BSN, RN Entered By: Evlyn Kanner on 09/15/2015 13:33:07 Madeline Cruz ([id number]) Madeline Cruz ([id number]) -------------------------------------------------------------------------------- HPI Details Patient Name: Madeline Cruz, Madeline Cruz 09/15/2015 12:45 Date of Service: PM Medical Record [id number] Number: Patient Account Number: [id number] Date of Birth/Sex: 1919/12/22 (82 y.o. Female) Afful, RN, BSN, Treating RN: [place] [facility] [place], [place] Physician: BETH Other Clinician: Odelia Gage Treating Referring Physician: Lehman Prom Physician/Extender: Tania Ade in Treatment: 7 History of Present Illness Location: left lateral foot Quality: Patient reports experiencing a sharp pain to affected area(s). Severity: Patient states wound are getting worse. Duration: Patient has had the wound for >  3 months prior to seeking treatment at the wound center Timing: Pain in wound is Intermittent (comes and goes Context: The wound appeared gradually over time Modifying Factors: Other treatment(s) tried include:local care and offloading Associated Signs and Symptoms: Patient reports having difficulty standing for long periods. HPI Description: 82 year old patient who has history of advanced dementia and has been living at a nursing home for a long while. She was a smoker in the past and quit smoking at the age of 22 and has a past medical history of advanced dementia, hyponatremia, urinary tract infection, failure to thrive, macular degeneration and glaucoma. She is also status post appendectomy. 08/04/2015 -- had a left foot x-ray which shows no acute fracture or dislocation. There is no suspicious periostitis or cortical destruction 09/15/2015 -- the daughter has noticed increased swelling and some redness of the foot and as per her previous discussion she is not interested in getting any invasive procedure done. Electronic Signature(s) Signed: 09/15/2015 1:33:56 PM  By: Evlyn Kanner MD, FACS Entered By: Evlyn Kanner on 09/15/2015 13:33:56 Madeline Cruz ([id number]) -------------------------------------------------------------------------------- Physical Exam Details Patient Name: Madeline Cruz, Madeline Cruz 09/15/2015 12:45 Date of Service: PM Medical Record [id number] Number: Patient Account Number: [id number] Date of Birth/Sex: [birth year] (82 y.o. Female) Afful, RN, BSN, Treating RN: [place] [facility] [place], [place] Physician: BETH Other Clinician: Odelia Gage Treating Referring Physician: Lehman Prom Physician/Extender: Tania Ade in Treatment: 7 Constitutional . Pulse regular. Respirations normal and unlabored. Afebrile. . Eyes Nonicteric. Reactive to light. Ears, Nose, Mouth, and Throat Lips, teeth, and gums WNL.Marland Kitchen Moist mucosa without lesions. Neck supple and nontender. No palpable supraclavicular or cervical adenopathy. Normal sized without goiter. Respiratory WNL. No retractions.. Cardiovascular Pedal Pulses WNL. No clubbing, cyanosis or edema. Lymphatic No adneopathy. No adenopathy. No adenopathy. Musculoskeletal Adexa without tenderness or enlargement.. Digits and nails w/o clubbing, cyanosis, infection, petechiae, ischemia, or inflammatory conditions.. Integumentary (Hair, Skin) No suspicious lesions. No crepitus or fluctuance. No peri-wound warmth or erythema. No masses.Marland Kitchen Psychiatric Judgement and insight Intact.. No evidence of depression, anxiety, or agitation.. Notes there is a new wound on the medial part of the left ankle and once the pressure was removed there is a 1 cm diameter ulceration with some slough at the base. No sharp dissection was done here. However the 2 lateral ankle and forefoot wounds have been sharply debrided with forceps and scissors and a much of the necrotic debris removed with no bleeding. Electronic Signature(s) Signed: 09/15/2015 1:34:45 PM By: Evlyn Kanner MD, FACS Entered By: Evlyn Kanner on 09/15/2015 13:34:45 Madeline Cruz ([id number]) -------------------------------------------------------------------------------- Physician Orders Details Patient Name: Madeline Cruz, Madeline Cruz 09/15/2015 12:45 Date of Service: PM Medical Record [id number] Number: Patient Account Number: [id number] Date of Birth/Sex: 10-21-1919 (82 y.o. Female) Afful, RN, BSN, Treating RN: [place] [facility] [place], [place] Physician: BETH Other Clinician: Odelia Gage Treating Referring Physician: Lehman Prom Physician/Extender: Tania Ade in Treatment: 7 Verbal / Phone Orders: Yes Clinician: Afful, RN, BSN, Rita Read Back and Verified: Yes Diagnosis Coding ICD-10 Coding Code Description [phone] Pressure ulcer of left ankle, stage 3 G30.1 Alzheimer's disease with late onset Z87.891 Personal history of nicotine dependence I70.245 Atherosclerosis of native arteries of left leg with ulceration of other part of foot Wound Cleansing Wound #1 Left,Lateral Foot o Clean wound with Normal Saline. Wound #2 Left,Lateral Malleolus o Clean wound with Normal Saline. Wound #3 Left,Medial Malleolus o Clean wound with Normal Saline. Anesthetic Wound #1 Left,Lateral Foot o Topical Lidocaine 4% cream applied to wound bed prior to debridement Wound #  3 Left,Medial Malleolus o Topical Lidocaine 4% cream applied to wound bed prior to debridement Wound #2 Left,Lateral Malleolus o Topical Lidocaine 4% cream applied to wound bed prior to debridement Skin Barriers/Peri-Wound Care Wound #1 Left,Lateral Foot o Skin Prep Wound #3 Left,Medial Malleolus o Skin Prep Madeline Cruz, Madeline Cruz ([id number]) Wound #2 Left,Lateral Malleolus o Skin Prep Primary Wound Dressing Wound #1 Left,Lateral Foot o Santyl Ointment Wound #3 Left,Medial Malleolus o Santyl Ointment Wound #2 Left,Lateral Malleolus o Santyl Ointment Secondary Dressing Wound #1 Left,Lateral Foot o Boardered Foam  Dressing Wound #3 Left,Medial Malleolus o Boardered Foam Dressing Wound #2 Left,Lateral Malleolus o Boardered Foam Dressing Dressing Change Frequency Wound #1 Left,Lateral Foot o Change dressing every day. Wound #3 Left,Medial Malleolus o Change dressing every day. Wound #2 Left,Lateral Malleolus o Change dressing every day. Follow-up Appointments Wound #1 Left,Lateral Foot o Return Appointment in 1 week. Wound #2 Left,Lateral Malleolus o Return Appointment in 1 week. Off-Loading Wound #1 Left,Lateral Foot o Heel suspension boot to: - SAGE BOOTS Wound #3 Left,Medial Malleolus o Heel suspension boot to: - SAGE BOOTS Madeline Cruz, Madeline Cruz ([id number]) Wound #2 Left,Lateral Malleolus o Heel suspension boot to: - SAGE BOOTS Additional Orders / Instructions Wound #1 Left,Lateral Foot o Increase protein intake. o Other: - ZInc oxide. MVI, Vitamin C Wound #3 Left,Medial Malleolus o Increase protein intake. o Other: - ZInc oxide. MVI, Vitamin C Wound #2 Left,Lateral Malleolus o Increase protein intake. - ZInc oxide. MVI, Vitamin C Home Health Wound #1 Left,Lateral Foot o Continue Home Health Visits - [facility] o Home Health Nurse may visit PRN to address patientos wound care needs. o FACE TO FACE ENCOUNTER: MEDICARE and MEDICAID PATIENTS: I certify that this patient is under my care and that I had a face-to-face encounter that meets the physician face-to-face encounter requirements with this patient on this date. The encounter with the patient was in whole or in part for the following MEDICAL CONDITION: (primary reason for Home Healthcare) MEDICAL NECESSITY: I certify, that based on my findings, NURSING services are a medically necessary home health service. HOME BOUND STATUS: I certify that my clinical findings support that this patient is homebound (i.e., Due to illness or injury, pt requires aid of supportive devices  such as crutches, cane, wheelchairs, walkers, the use of special transportation or the assistance of another person to leave their place of residence. There is a normal inability to leave the home and doing so requires considerable and taxing effort. Other absences are for medical reasons / religious services and are infrequent or of short duration when for other reasons). o If current dressing causes regression in wound condition, may D/C ordered dressing product/s and apply Normal Saline Moist Dressing daily until next Wound Healing Center / Other MD appointment. Notify Wound Healing Center of regression in wound condition at [phone]. o Please direct any NON-WOUND related issues/requests for orders to patient's Primary Care Physician Wound #2 Left,Lateral Malleolus o Continue Home Health Visits - [facility] o Home Health Nurse may visit PRN to address patientos wound care needs. o FACE TO FACE ENCOUNTER: MEDICARE and MEDICAID PATIENTS: I certify that this patient is under my care and that I had a face-to-face encounter that meets the physician face-to-face encounter requirements with this patient on this date. The encounter with the patient was in whole or in part for the following MEDICAL CONDITION: (primary reason for Home Healthcare) MEDICAL NECESSITY: I certify, that based on my findings, NURSING  services are a medically necessary home health service. HOME BOUND STATUS: I certify that my clinical findings support that this patient is homebound (i.e., Due to illness or injury, pt requires aid of supportive devices such as crutches, cane, wheelchairs, walkers, the use of special Madeline Cruz, Madeline Cruz ([id number]) transportation or the assistance of another person to leave their place of residence. There is a normal inability to leave the home and doing so requires considerable and taxing effort. Other absences are for medical reasons / religious  services and are infrequent or of short duration when for other reasons). o If current dressing causes regression in wound condition, may D/C ordered dressing product/s and apply Normal Saline Moist Dressing daily until next Wound Healing Center / Other MD appointment. Notify Wound Healing Center of regression in wound condition at [phone]. o Please direct any NON-WOUND related issues/requests for orders to patient's Primary Care Physician Medications-please add to medication list. Wound #1 Left,Lateral Foot o Santyl Enzymatic Ointment o Other: - ZINC, ViTAMIN C, MVI o P.O. Antibiotics - doxycycline 100mg  BID x 14 days Wound #2 Left,Lateral Malleolus o Santyl Enzymatic Ointment o Other: - ZINC, ViTAMIN C, MVI o P.O. Antibiotics - doxycycline 100mg  BID x 14 days Wound #3 Left,Medial Malleolus o Santyl Enzymatic Ointment o Other: - ZINC, ViTAMIN C, MVI o P.O. Antibiotics - doxycycline 100mg  BID x 14 days Electronic Signature(s) Signed: 09/15/2015 1:42:11 PM By: Elpidio Eric BSN, RN Signed: 09/15/2015 4:56:07 PM By: Evlyn Kanner MD, FACS Entered By: Elpidio Eric on 09/15/2015 13:42:10 Madeline Cruz ([id number]) -------------------------------------------------------------------------------- Prescription 09/15/2015 Patient Name: Madeline Cruz Physician: Evlyn Kanner MD Date of Birth: Dec 15, 1919 NPI#: [id number] Sex: F DEA#: [id number] Phone #: [phone] License #: Patient Address: [facility] ATTN [place] [place] [address] [facility] [place], [place] [address] [address], [address] [place], [place] [address] [id number] Allergies sulfa Antibiotics Physician's Orders Santyl Enzymatic Ointment Signature(s): Date(s): Electronic Signature(s) Signed: 09/15/2015 4:08:52 PM By: Elpidio Eric BSN, RN Signed: 09/15/2015 4:56:07 PM By: Evlyn Kanner MD, FACS Entered By: Elpidio Eric on  09/15/2015 13:42:11 Madeline Cruz ([id number]) --------------------------------------------------------------------------------  Problem List Details Patient Name: Madeline Cruz, Madeline Cruz 09/15/2015 12:45 Date of Service: PM Medical Record [id number] Number: Patient Account Number: [id number] Date of Birth/Sex: 1919/12/24 (82 y.o. Female) Afful, RN, BSN, Treating RN: [place] [facility] [place], [place] Physician: BETH Other Clinician: Odelia Gage Treating Referring Physician: Lehman Prom Physician/Extender: Tania Ade in Treatment: 7 Active Problems ICD-10 Encounter Code Description Active Date Diagnosis L89.523 Pressure ulcer of left ankle, stage 3 07/28/2015 Yes G30.1 Alzheimer's disease with late onset 07/28/2015 Yes Z87.891 Personal history of nicotine dependence 07/28/2015 Yes I70.245 Atherosclerosis of native arteries of left leg with ulceration 07/28/2015 Yes of other part of foot Inactive Problems Resolved Problems Electronic Signature(s) Signed: 09/15/2015 1:32:43 PM By: Evlyn Kanner MD, FACS Entered By: Evlyn Kanner on 09/15/2015 13:32:43 Madeline Cruz ([id number]) -------------------------------------------------------------------------------- Progress Note Details Patient Name: ZARRAH, LOVELAND 09/15/2015 12:45 Date of Service: PM Medical Record [id number] Number: Patient Account Number: [id number] Date of Birth/Sex: 12-29-1919 (82 y.o. Female) Afful, RN, BSN, Treating RN: [place] [facility] [place], [place] Physician: BETH Other Clinician: Odelia Gage Treating Referring Physician: Lehman Prom Physician/Extender: Tania Ade in Treatment: 7 Subjective Chief Complaint Information obtained from Patient Patient is at the clinic for treatment of an open pressure ulcer with a arterial competent and this has been there on the left foot since the last 3 months History of Present Illness (HPI) The following HPI elements  were documented for the patient's  wound: Location: left lateral foot Quality: Patient reports experiencing a sharp pain to affected area(s). Severity: Patient states wound are getting worse. Duration: Patient has had the wound for > 3 months prior to seeking treatment at the wound center Timing: Pain in wound is Intermittent (comes and goes Context: The wound appeared gradually over time Modifying Factors: Other treatment(s) tried include:local care and offloading Associated Signs and Symptoms: Patient reports having difficulty standing for long periods. 82 year old patient who has history of advanced dementia and has been living at a nursing home for a long while. She was a smoker in the past and quit smoking at the age of 52 and has a past medical history of advanced dementia, hyponatremia, urinary tract infection, failure to thrive, macular degeneration and glaucoma. She is also status post appendectomy. 08/04/2015 -- had a left foot x-ray which shows no acute fracture or dislocation. There is no suspicious periostitis or cortical destruction 09/15/2015 -- the daughter has noticed increased swelling and some redness of the foot and as per her previous discussion she is not interested in getting any invasive procedure done. Objective Constitutional Pulse regular. Respirations normal and unlabored. Afebrile. Madeline Cruz, Madeline Cruz ([id number]) Vitals Time Taken: 1:15 PM, Height: 59 in, Pulse: 56 bpm, Respiratory Rate: 17 breaths/min, Blood Pressure: 174/143 mmHg. Eyes Nonicteric. Reactive to light. Ears, Nose, Mouth, and Throat Lips, teeth, and gums WNL.Marland Kitchen Moist mucosa without lesions. Neck supple and nontender. No palpable supraclavicular or cervical adenopathy. Normal sized without goiter. Respiratory WNL. No retractions.. Cardiovascular Pedal Pulses WNL. No clubbing, cyanosis or edema. Lymphatic No adneopathy. No adenopathy. No adenopathy. Musculoskeletal Adexa without tenderness or enlargement.. Digits and nails w/o  clubbing, cyanosis, infection, petechiae, ischemia, or inflammatory conditions.Marland Kitchen Psychiatric Judgement and insight Intact.. No evidence of depression, anxiety, or agitation.. General Notes: there is a new wound on the medial part of the left ankle and once the pressure was removed there is a 1 cm diameter ulceration with some slough at the base. No sharp dissection was done here. However the 2 lateral ankle and forefoot wounds have been sharply debrided with forceps and scissors and a much of the necrotic debris removed with no bleeding. Integumentary (Hair, Skin) No suspicious lesions. No crepitus or fluctuance. No peri-wound warmth or erythema. No masses.. Wound #1 status is Open. Original cause of wound was Pressure Injury. The wound is located on the Left,Lateral Foot. The wound measures 3.2cm length x 2cm width x 0.2cm depth; 5.027cm^2 area and 1.005cm^3 volume. The wound is limited to skin breakdown. There is no tunneling or undermining noted. There is a large amount of serosanguineous drainage noted. The wound margin is distinct with the outline attached to the wound base. There is small (1-33%) pink, pale granulation within the wound bed. There is a large (67-100%) amount of necrotic tissue within the wound bed including Adherent Slough. The periwound skin appearance exhibited: Localized Edema, Maceration, Moist. The periwound skin appearance did not exhibit: Callus, Crepitus, Excoriation, Fluctuance, Friable, Induration, Rash, Scarring, Dry/Scaly, Atrophie Blanche, Cyanosis, Ecchymosis, Hemosiderin Staining, Mottled, Pallor, Rubor, Erythema. Periwound temperature was noted as No Abnormality. The periwound has tenderness on palpation. Wound #2 status is Open. Original cause of wound was Pressure Injury. The wound is located on the [place] ([id number]) Left,Lateral Malleolus. The wound measures 4cm length x 2.4cm width x 0.2cm depth; 7.54cm^2 area and 1.508cm^3 volume. The wound  is limited to skin breakdown. There is no tunneling or undermining noted. There is a large  amount of serosanguineous drainage noted. The wound margin is distinct with the outline attached to the wound base. There is small (1-33%) red, pink granulation within the wound bed. There is a large (67-100%) amount of necrotic tissue within the wound bed including Adherent Slough. The periwound skin appearance exhibited: Localized Edema, Maceration, Moist. The periwound skin appearance did not exhibit: Callus, Crepitus, Excoriation, Fluctuance, Friable, Induration, Rash, Scarring, Dry/Scaly, Atrophie Blanche, Cyanosis, Ecchymosis, Hemosiderin Staining, Mottled, Pallor, Rubor, Erythema. Periwound temperature was noted as No Abnormality. The periwound has tenderness on palpation. Wound #3 status is Open. Original cause of wound was Gradually Appeared. The wound is located on the Left,Medial Malleolus. The wound measures 1cm length x 1cm width x 0.3cm depth; 0.785cm^2 area and 0.236cm^3 volume. The wound is limited to skin breakdown. There is no tunneling or undermining noted. There is a large amount of serosanguineous drainage noted. The wound margin is distinct with the outline attached to the wound base. There is no granulation within the wound bed. There is a large (67-100%) amount of necrotic tissue within the wound bed including Eschar and Adherent Slough. The periwound skin appearance exhibited: Moist. The periwound skin appearance did not exhibit: Callus, Crepitus, Excoriation, Fluctuance, Friable, Induration, Localized Edema, Rash, Scarring, Dry/Scaly, Maceration, Atrophie Blanche, Cyanosis, Ecchymosis, Hemosiderin Staining, Mottled, Pallor, Rubor, Erythema. Periwound temperature was noted as No Abnormality. The periwound has tenderness on palpation. Assessment Active Problems ICD-10 L89.523 - Pressure ulcer of left ankle, stage 3 G30.1 - Alzheimer's disease with late onset Z87.891 - Personal  history of nicotine dependence I70.245 - Atherosclerosis of native arteries of left leg with ulceration of other part of foot there is an element of cellulitis and edema of the left lower extremity and due to the family's wishes of not wanting any invasive procedure to be done I think it's reasonable to continue with Santyl ointment locally and I will empirically put her on doxycycline 100 mg twice a day for 2 weeks. The daughter was at the bedside has had all questions answered and is agreeable to IV antibiotics if the need arises. Procedures Wound #1 Wound #1 is a Pressure Ulcer located on the Left,Lateral Foot . There was a Skin/Subcutaneous Tissue Debridement ([id number]) debridement with total area of 6.4 sq cm performed by Evlyn Kanner, MD. with Madeline Cruz ([id number]) the following instrument(s): Forceps and Scissors to remove Non-Viable tissue/material including Exudate, Fibrin/Slough, and Subcutaneous after achieving pain control using Lidocaine 4% Topical Solution. A time out was conducted prior to the start of the procedure. A Minimum amount of bleeding was controlled with Pressure. The procedure was tolerated well with a pain level of 0 throughout and a pain level of 0 following the procedure. Post Debridement Measurements: 3.2cm length x 2cm width x 0.2cm depth; 1.005cm^3 volume. Post debridement Stage noted as Unstageable/Unclassified. Post procedure Diagnosis Wound #1: Same as Pre-Procedure Wound #2 Wound #2 is a Pressure Ulcer located on the Left,Lateral Malleolus . There was a Skin/Subcutaneous Tissue Debridement ([id number]) debridement with total area of 9.6 sq cm performed by Evlyn Kanner, MD. with the following instrument(s): Forceps and Scissors to remove Non-Viable tissue/material including Exudate, Fibrin/Slough, and Subcutaneous after achieving pain control using Lidocaine 4% Topical Solution. A time out was conducted prior to the start of the procedure. A  Minimum amount of bleeding was controlled with Pressure. The procedure was tolerated well with a pain level of 0 throughout and a pain level of 0 following the procedure. Post Debridement Measurements: 4cm length x 2.4cm width  x 0.2cm depth; 1.508cm^3 volume. Post debridement Stage noted as Category/Stage III. Post procedure Diagnosis Wound #2: Same as Pre-Procedure Plan Wound Cleansing: Wound #1 Left,Lateral Foot: Clean wound with Normal Saline. Wound #2 Left,Lateral Malleolus: Clean wound with Normal Saline. Wound #3 Left,Medial Malleolus: Clean wound with Normal Saline. Anesthetic: Wound #1 Left,Lateral Foot: Topical Lidocaine 4% cream applied to wound bed prior to debridement Wound #3 Left,Medial Malleolus: Topical Lidocaine 4% cream applied to wound bed prior to debridement Wound #2 Left,Lateral Malleolus: Topical Lidocaine 4% cream applied to wound bed prior to debridement Skin Barriers/Peri-Wound Care: Wound #1 Left,Lateral Foot: Skin Prep Wound #3 Left,Medial Malleolus: Skin Prep Wound #2 Left,Lateral Malleolus: Skin Prep Primary Wound Dressing: Wound #1 Left,Lateral Foot: Madeline RoupUCKER, Ritisha ([id number]) Santyl Ointment Wound #3 Left,Medial Malleolus: Santyl Ointment Wound #2 Left,Lateral Malleolus: Santyl Ointment Secondary Dressing: Wound #1 Left,Lateral Foot: Boardered Foam Dressing Wound #3 Left,Medial Malleolus: Boardered Foam Dressing Wound #2 Left,Lateral Malleolus: Boardered Foam Dressing Dressing Change Frequency: Wound #1 Left,Lateral Foot: Change dressing every day. Wound #3 Left,Medial Malleolus: Change dressing every day. Wound #2 Left,Lateral Malleolus: Change dressing every day. Follow-up Appointments: Wound #1 Left,Lateral Foot: Return Appointment in 1 week. Wound #2 Left,Lateral Malleolus: Return Appointment in 1 week. Off-Loading: Wound #1 Left,Lateral Foot: Heel suspension boot to: - SAGE [employer] Wound #3 Left,Medial Malleolus: Heel  suspension boot to: - SAGE [employer] Wound #2 Left,Lateral Malleolus: Heel suspension boot to: - SAGE BOOTS Additional Orders / Instructions: Wound #1 Left,Lateral Foot: Increase protein intake. Other: - ZInc oxide. MVI, Vitamin C Wound #3 Left,Medial Malleolus: Increase protein intake. Other: - ZInc oxide. MVI, Vitamin C Wound #2 Left,Lateral Malleolus: Increase protein intake. - ZInc oxide. MVI, Vitamin C Home Health: Wound #1 Left,Lateral Foot: Continue Home Health Visits - [facility] Home Health Nurse may visit PRN to address patient s wound care needs. FACE TO FACE ENCOUNTER: MEDICARE and MEDICAID PATIENTS: I certify that this patient is under my care and that I had a face-to-face encounter that meets the physician face-to-face encounter requirements with this patient on this date. The encounter with the patient was in whole or in part for the following MEDICAL CONDITION: (primary reason for Home Healthcare) MEDICAL NECESSITY: I certify, that based on my findings, NURSING services are a medically necessary home health service. HOME BOUND STATUS: I certify that my clinical findings support that this patient is homebound (i.e., Due to illness or injury, pt requires aid of supportive devices such as crutches, cane, wheelchairs, walkers, the use Madeline RoupUCKER, Thresia ([id number]) of special transportation or the assistance of another person to leave their place of residence. There is a normal inability to leave the home and doing so requires considerable and taxing effort. Other absences are for medical reasons / religious services and are infrequent or of short duration when for other reasons). If current dressing causes regression in wound condition, may D/C ordered dressing product/s and apply Normal Saline Moist Dressing daily until next Wound Healing Center / Other MD appointment. Notify Wound Healing Center of regression in wound condition at  [phone]. Please direct any NON-WOUND related issues/requests for orders to patient's Primary Care Physician Wound #2 Left,Lateral Malleolus: Continue Home Health Visits - [facility] Home Health Nurse may visit PRN to address patient s wound care needs. FACE TO FACE ENCOUNTER: MEDICARE and MEDICAID PATIENTS: I certify that this patient is under my care and that I had a face-to-face encounter that meets the physician face-to-face encounter requirements  with this patient on this date. The encounter with the patient was in whole or in part for the following MEDICAL CONDITION: (primary reason for Home Healthcare) MEDICAL NECESSITY: I certify, that based on my findings, NURSING services are a medically necessary home health service. HOME BOUND STATUS: I certify that my clinical findings support that this patient is homebound (i.e., Due to illness or injury, pt requires aid of supportive devices such as crutches, cane, wheelchairs, walkers, the use of special transportation or the assistance of another person to leave their place of residence. There is a normal inability to leave the home and doing so requires considerable and taxing effort. Other absences are for medical reasons / religious services and are infrequent or of short duration when for other reasons). If current dressing causes regression in wound condition, may D/C ordered dressing product/s and apply Normal Saline Moist Dressing daily until next Wound Healing Center / Other MD appointment. Notify Wound Healing Center of regression in wound condition at [phone]. Please direct any NON-WOUND related issues/requests for orders to patient's Primary Care Physician Medications-please add to medication list.: Wound #1 Left,Lateral Foot: Santyl Enzymatic Ointment Other: - ZINC, ViTAMIN C, MVI P.O. Antibiotics - doxycycline 100mg  BID x 14 days Wound #2 Left,Lateral Malleolus: Santyl Enzymatic  Ointment Other: - ZINC, ViTAMIN C, MVI P.O. Antibiotics - doxycycline 100mg  BID x 14 days Wound #3 Left,Medial Malleolus: Santyl Enzymatic Ointment Other: - ZINC, ViTAMIN C, MVI P.O. Antibiotics - doxycycline 100mg  BID x 14 days there is an element of cellulitis and edema of the left lower extremity and due to the family's wishes of not wanting any invasive procedure to be done I think it's reasonable to continue with Santyl ointment locally and I will empirically put her on doxycycline 100 mg twice a day for 2 weeks. The daughter was at the bedside has had all questions answered and is agreeable to IV antibiotics if the need arises. JUWANA, THORESON ([id number]) Electronic Signature(s) Signed: 09/15/2015 4:59:09 PM By: Evlyn Kanner MD, FACS Previous Signature: 09/15/2015 1:35:49 PM Version By: Evlyn Kanner MD, FACS Entered By: Evlyn Kanner on 09/15/2015 16:59:09 Madeline Cruz ([id number]) -------------------------------------------------------------------------------- SuperBill Details Patient Name: Madeline Cruz Date of Service: 09/15/2015 Medical Record Patient Account Number: [id number] Number: Afful, RN, BSN, Treating RN: Date of Birth/Sex: Apr 06, 1920 (82 y.o. Female) [facility], [place] Other Clinician: Physician: BETH Treating Avrianna Smart, Threasa Alpha, MARY Physician/Extender: Referring Physician: Derald Macleod in Treatment: 7 Diagnosis Coding ICD-10 Codes Code Description [phone] Pressure ulcer of left ankle, stage 3 G30.1 Alzheimer's disease with late onset Z87.891 Personal history of nicotine dependence I70.245 Atherosclerosis of native arteries of left leg with ulceration of other part of foot Facility Procedures CPT4: Description Modifier Quantity Code [id number] 11042 - DEB SUBQ TISSUE 20 SQ CM/< 1 ICD-10 Description Diagnosis L89.523 Pressure ulcer of left ankle, stage 3 G30.1 Alzheimer's disease with late onset Z87.891 Personal history of  nicotine dependence  I70.245 Atherosclerosis of native arteries of left leg with ulceration of other part of foot Physician Procedures CPT4: Description Modifier Quantity Code [id number] 11042 - WC PHYS SUBQ TISS 20 SQ CM 1 ICD-10 Description Diagnosis L89.523 Pressure ulcer of left ankle, stage 3 G30.1 Alzheimer's disease with late onset Z87.891 Personal history of nicotine dependence  I70.245 Atherosclerosis of native arteries of left leg with ulceration of other part of foot Electronic Signature(s) SAVONNA, BIRCHMEIER ([id number]) Signed: 09/15/2015 1:36:05 PM By: Evlyn Kanner MD, FACS Entered By: Evlyn Kanner on 09/15/2015 13:36:05

## 2015-09-22 ENCOUNTER — Encounter: Payer: Medicare Other | Admitting: Surgery

## 2015-09-22 DIAGNOSIS — I70245 Atherosclerosis of native arteries of left leg with ulceration of other part of foot: Secondary | ICD-10-CM | POA: Diagnosis not present

## 2015-09-22 NOTE — Progress Notes (Addendum)
Madeline Cruz, Madeline Cruz ([id number]) Visit Report for 09/22/2015 Arrival Information Details Patient Name: Madeline Cruz, Madeline Cruz 09/22/2015 12:45 Date of Service: PM Medical Record [id number] Number: Patient Account Number: [id number] Date of Birth/Sex: 1919-12-23 (82 y.o. Female) Afful, RN, BSN, Treating RN: [place] Sink Primary Care [place], [place] Physician: BETH Other Clinician: Odelia Gage Treating Referring Physician: Lehman Prom Physician/Extender: Tania Ade in Treatment: 8 Visit Information History Since Last Visit Added or deleted any medications: No Patient Arrived: Wheel Chair Any new allergies or adverse reactions: No Arrival Time: 12:49 Had a fall or experienced change in No Accompanied By: dtr activities of daily living that may affect Transfer Assistance: EasyPivot risk of falls: Patient Lift Signs or symptoms of abuse/neglect since last No Patient Identification Verified: Yes visito Secondary Verification Process Yes Hospitalized since last visit: No Completed: Has Dressing in Place as Prescribed: Yes Patient Requires Transmission- No Pain Present Now: No Based Precautions: Patient Has Alerts: No Electronic Signature(s) Signed: 09/22/2015 12:50:03 PM By: Elpidio Eric BSN, RN Entered By: Elpidio Eric on 09/22/2015 12:50:03 Madeline Cruz ([id number]) -------------------------------------------------------------------------------- Clinic Level of Care Assessment Details Patient Name: Madeline Cruz 09/22/2015 12:45 Date of Service: PM Medical Record [id number] Number: Patient Account Number: [id number] Date of Birth/Sex: December 30, 1919 (82 y.o. Female) Afful, RN, BSN, Treating RN: [place] Sink Primary Care [place], [place] Physician: BETH Other Clinician: Odelia Gage Treating Referring Physician: Evlyn Kanner BETH Physician/Extender: Tania Ade in Treatment: 8 Clinic Level of Care Assessment Items TOOL 4 Quantity Score []  - Use when only an EandM is performed on  FOLLOW-UP visit 0 ASSESSMENTS - Nursing Assessment / Reassessment X - Reassessment of Co-morbidities (includes updates in patient status) 1 10 X - Reassessment of Adherence to Treatment Plan 1 5 ASSESSMENTS - Wound and Skin Assessment / Reassessment []  - Simple Wound Assessment / Reassessment - one wound 0 X - Complex Wound Assessment / Reassessment - multiple wounds 3 5 []  - Dermatologic / Skin Assessment (not related to wound area) 0 ASSESSMENTS - Focused Assessment []  - Circumferential Edema Measurements - multi extremities 0 []  - Nutritional Assessment / Counseling / Intervention 0 X - Lower Extremity Assessment (monofilament, tuning fork, pulses) 1 5 []  - Peripheral Arterial Disease Assessment (using hand held doppler) 0 ASSESSMENTS - Ostomy and/or Continence Assessment and Care []  - Incontinence Assessment and Management 0 []  - Ostomy Care Assessment and Management (repouching, etc.) 0 PROCESS - Coordination of Care X - Simple Patient / Family Education for ongoing care 1 15 []  - Complex (extensive) Patient / Family Education for ongoing care 0 []  - Staff obtains Consents, Records, Test Results / Process Orders 0 Madeline Cruz, Madeline Cruz ([id number]) []  - Staff telephones HHA, Nursing Homes / Clarify orders / etc 0 []  - Routine Transfer to another Facility (non-emergent condition) 0 []  - Routine Hospital Admission (non-emergent condition) 0 []  - New Admissions / Manufacturing engineer / Ordering NPWT, Apligraf, etc. 0 []  - Emergency Hospital Admission (emergent condition) 0 []  - Simple Discharge Coordination 0 []  - Complex (extensive) Discharge Coordination 0 PROCESS - Special Needs []  - Pediatric / Minor Patient Management 0 []  - Isolation Patient Management 0 []  - Hearing / Language / Visual special needs 0 []  - Assessment of Community assistance (transportation, D/C planning, etc.) 0 []  - Additional assistance / Altered mentation 0 []  - Support Surface(s) Assessment (bed, cushion,  seat, etc.) 0 INTERVENTIONS - Wound Cleansing / Measurement []  - Simple Wound Cleansing - one wound 0 X - Complex Wound Cleansing - multiple wounds 3 5 X - Wound Imaging (photographs -  any number of wounds) 1 5  - Wound Tracing (instead of photographs) 0  - Simple Wound Measurement - one wound 0 X - Complex Wound Measurement - multiple wounds 3 5 INTERVENTIONS - Wound Dressings  - Small Wound Dressing one or multiple wounds 0 X - Medium Wound Dressing one or multiple wounds 3 15  - Large Wound Dressing one or multiple wounds 0  - Application of Medications - topical 0  - Application of Medications - injection 0 Madeline Cruz, Madeline Cruz ([id number]) INTERVENTIONS - Miscellaneous  - External ear exam 0  - Specimen Collection (cultures, biopsies, blood, body fluids, etc.) 0  - Specimen(s) / Culture(s) sent or taken to Lab for analysis 0 X - Patient Transfer (multiple staff / Nurse, adult / Similar devices) 1 10  - Simple Staple / Suture removal (25 or less) 0  - Complex Staple / Suture removal (26 or more) 0  - Hypo / Hyperglycemic Management (close monitor of Blood Glucose) 0  - Ankle / Brachial Index (ABI) - do not check if billed separately 0 X - Vital Signs 1 5 Has the patient been seen at the hospital within the last three years: Yes Total Score: 145 Level Of Care: New/Established - Level 4 Electronic Signature(s) Signed: 09/22/2015 4:56:55 PM By: Elpidio Eric BSN, RN Entered By: Elpidio Eric on 09/22/2015 13:19:06 Madeline Cruz ([id number]) -------------------------------------------------------------------------------- Encounter Discharge Information Details Patient Name: Madeline Cruz, Madeline Cruz 09/22/2015 12:45 Date of Service: PM Medical Record [id number] Number: Patient Account Number: [id number] Date of Birth/Sex: [birth year] (82 y.o. Female) Afful, RN, BSN, Treating RN: [place] Sink Primary Care [place], [place] Physician: BETH Other Clinician: Odelia Gage  Treating Referring Physician: Lehman Prom Physician/Extender: Tania Ade in Treatment: 8 Encounter Discharge Information Items Discharge Pain Level: 0 Discharge Condition: Stable Ambulatory Status: Wheelchair Discharge Destination: Nursing Home Transportation: Private Auto Accompanied By: dtr Schedule Follow-up Appointment: No Medication Reconciliation completed and provided to Patient/Care No Clearence Vitug: Provided on Clinical Summary of Care: 09/22/2015 Form Type Recipient Paper Patient LT Electronic Signature(s) Signed: 09/22/2015 4:56:55 PM By: Elpidio Eric BSN, RN Previous Signature: 09/22/2015 1:18:28 PM Version By: Gwenlyn Perking Entered By: Elpidio Eric on 09/22/2015 13:19:58 Madeline Cruz ([id number]) -------------------------------------------------------------------------------- Lower Extremity Assessment Details Patient Name: Madeline Cruz, Madeline Cruz 09/22/2015 12:45 Date of Service: PM Medical Record [id number] Number: Patient Account Number: [id number] Date of Birth/Sex: 1919-10-13 (82 y.o. Female) Afful, RN, BSN, Treating RN: [place] Sink Primary Care [place], [place] Physician: BETH Other Clinician: Odelia Gage Treating Referring Physician: Evlyn Kanner BETH Physician/Extender: Tania Ade in Treatment: 8 Vascular Assessment Pulses: Posterior Tibial Dorsalis Pedis Doppler: [Left:Monophasic] Extremity colors, hair growth, and conditions: Extremity Color: [Left:Normal] Hair Growth on Extremity: [Left:No] Temperature of Extremity: [Left:Warm] Capillary Refill: [Left:< 3 seconds] Toe Nail Assessment Left: Right: Thick: Yes Discolored: Yes Deformed: No Improper Length and Hygiene: No Electronic Signature(s) Signed: 09/22/2015 12:51:26 PM By: Elpidio Eric BSN, RN Entered By: Elpidio Eric on 09/22/2015 12:51:26 Madeline Cruz ([id number]) -------------------------------------------------------------------------------- Multi Wound Chart Details Patient Name: Madeline Cruz, Madeline Cruz 09/22/2015 12:45 Date of Service: PM Medical Record [id number] Number: Patient Account Number: [id number] Date of Birth/Sex: 1919-08-11 (82 y.o. Female) Afful, RN, BSN, Treating RN: [place] Sink Primary Care [place], [place] Physician: BETH Other Clinician: Odelia Gage Treating Referring Physician: Lehman Prom Physician/Extender: Tania Ade in Treatment: 8 Vital Signs Height(in): 59 Pulse(bpm): 59 Weight(lbs): Blood Pressure 148/66 (mmHg): Body Mass Index(BMI): Temperature(F): Respiratory Rate 17 (breaths/min): Photos: [1:No Photos] [2:No Photos] [3:No Photos] Wound Location: [1:Left Foot - Lateral] [2:Left Malleolus - Lateral] [3:Left Malleolus - Medial] Wounding Event: [1:Pressure Injury] [  2:Pressure Injury] [3:Gradually Appeared] Primary Etiology: [1:Pressure Ulcer] [2:Pressure Ulcer] [3:Pressure Ulcer] Comorbid History: [1:Cataracts, Asthma, Arrhythmia, History of pressure wounds, Osteoarthritis, Dementia] [2:Cataracts, Asthma, Arrhythmia, History of pressure wounds, Osteoarthritis, Dementia] [3:Cataracts, Asthma, Arrhythmia, History of pressure  wounds, Osteoarthritis, Dementia] Date Acquired: [1:05/10/2015] [2:05/10/2015] [3:09/11/2015] Weeks of Treatment: [1:8] [2:8] [3:1] Wound Status: [1:Open] [2:Open] [3:Open] Measurements L x W x D 3.5x1.8x0.2 [2:4x2x0.2] [3:0.8x0.8x0.3] (cm) Area (cm) : [1:4.948] [2:6.283] [3:0.503] Volume (cm) : [1:0.99] [2:1.257] [3:0.151] % Reduction in Area: [1:-5.00%] [2:-60.00%] [3:35.90%] % Reduction in Volume: -5.10% [2:-60.10%] [3:36.00%] Classification: [1:Unstageable/Unclassified] [2:Unstageable/Unclassified] [3:Category/Stage III] Exudate Amount: [1:Large] [2:Large] [3:Large] Exudate Type: [1:Serosanguineous] [2:Serosanguineous] [3:Serosanguineous] Exudate Color: [1:red, brown] [2:red, brown] [3:red, brown] Wound Margin: [1:Distinct, outline attached] [2:Distinct, outline attached] [3:Distinct, outline  attached] Granulation Amount: [1:Small (1-33%)] [2:Small (1-33%)] [3:Small (1-33%)] Granulation Quality: [1:Pink, Pale] [2:Red, Pink] [3:Pink, Pale] Necrotic Amount: [1:Large (67-100%)] [2:Large (67-100%)] [3:Large (67-100%)] Exposed Structures: [1:Fascia: No Fat: No] [2:Fascia: No Fat: No] [3:Fascia: No Fat: No] Tendon: No Tendon: No Tendon: No Muscle: No Muscle: No Muscle: No Joint: No Joint: No Joint: No Bone: No Bone: No Bone: No Limited to Skin Limited to Skin Limited to Skin Breakdown Breakdown Breakdown Epithelialization: None None N/A Periwound Skin Texture: Edema: Yes Edema: Yes Edema: No Excoriation: No Excoriation: No Excoriation: No Induration: No Induration: No Induration: No Callus: No Callus: No Callus: No Crepitus: No Crepitus: No Crepitus: No Fluctuance: No Fluctuance: No Fluctuance: No Friable: No Friable: No Friable: No Rash: No Rash: No Rash: No Scarring: No Scarring: No Scarring: No Periwound Skin Maceration: Yes Maceration: Yes Moist: Yes Moisture: Moist: Yes Moist: Yes Maceration: No Dry/Scaly: No Dry/Scaly: No Dry/Scaly: No Periwound Skin Color: Atrophie Blanche: No Atrophie Blanche: No Atrophie Blanche: No Cyanosis: No Cyanosis: No Cyanosis: No Ecchymosis: No Ecchymosis: No Ecchymosis: No Erythema: No Erythema: No Erythema: No Hemosiderin Staining: No Hemosiderin Staining: No Hemosiderin Staining: No Mottled: No Mottled: No Mottled: No Pallor: No Pallor: No Pallor: No Rubor: No Rubor: No Rubor: No Temperature: No Abnormality No Abnormality No Abnormality Tenderness on Yes Yes Yes Palpation: Wound Preparation: Ulcer Cleansing: Ulcer Cleansing: Ulcer Cleansing: Rinsed/Irrigated with Rinsed/Irrigated with Rinsed/Irrigated with Saline Saline Saline Topical Anesthetic Topical Anesthetic Topical Anesthetic Applied: Other: lidocaine Applied: Other: lidocaine Applied: Other: lidocaine 4% 4% 4% Treatment  Notes Electronic Signature(s) Signed: 09/22/2015 4:56:55 PM By: Elpidio Eric BSN, RN Entered By: Elpidio Eric on 09/22/2015 13:17:38 Madeline Cruz ([id number]) -------------------------------------------------------------------------------- Multi-Disciplinary Care Plan Details Patient Name: Madeline Cruz, Madeline Cruz 09/22/2015 12:45 Date of Service: PM Medical Record [id number] Number: Patient Account Number: [id number] Date of Birth/Sex: [birth year] (82 y.o. Female) Afful, RN, BSN, Treating RN: [place] Sink Primary Care [place], [place] Physician: BETH Other Clinician: Odelia Gage Treating Referring Physician: Lehman Prom Physician/Extender: Tania Ade in Treatment: 8 Active Inactive Electronic Signature(s) Signed: 09/29/2015 2:46:27 PM By: Elpidio Eric BSN, RN Previous Signature: 09/22/2015 4:56:55 PM Version By: Elpidio Eric BSN, RN Entered By: Elpidio Eric on 09/29/2015 14:46:27 Madeline Cruz ([id number]) -------------------------------------------------------------------------------- Pain Assessment Details Patient Name: Madeline Cruz, Madeline Cruz 09/22/2015 12:45 Date of Service: PM Medical Record [id number] Number: Patient Account Number: [id number] Date of Birth/Sex: 1920/01/05 (82 y.o. Female) Afful, RN, BSN, Treating RN: [place] Sink Primary Care [place], [place] Physician: BETH Other Clinician: Odelia Gage Treating Referring Physician: Lehman Prom Physician/Extender: Tania Ade in Treatment: 8 Active Problems Location of Pain Severity and Description of Pain Patient Has Paino Patient Unable to Respond Site Locations Pain Management and Medication Current Pain Management: Electronic Signature(s) Signed: 09/22/2015 12:50:14 PM By: Elpidio Eric BSN, RN Entered By:  Elpidio Eric on 09/22/2015 12:50:14 SHANEE, BATCH ([id number]) -------------------------------------------------------------------------------- Patient/Caregiver Education Details Patient Name: Madeline Cruz, Madeline Cruz 09/22/2015  12:45 Date of Service: PM Medical Record [id number] Number: Patient Account Number: [id number] Date of Birth/Gender: 1919-08-23 (82 y.o. Female) Afful, RN, BSN, Treating RN: [place] Sink Primary Care [place], [place] Physician: BETH Other Clinician: Odelia Gage Treating Referring Physician: Lehman Prom Physician/Extender: Tania Ade in Treatment: 8 Education Assessment Education Provided To: Caregiver Education Topics Provided Pressure: Methods: Explain/Verbal Responses: State content correctly [facility]: Methods: Explain/Verbal Responses: State content correctly Wound/Skin Impairment: Methods: Explain/Verbal Responses: State content correctly Electronic Signature(s) Signed: 09/22/2015 4:56:55 PM By: Elpidio Eric BSN, RN Entered By: Elpidio Eric on 09/22/2015 13:20:16 Madeline Cruz ([id number]) -------------------------------------------------------------------------------- Wound Assessment Details Patient Name: Madeline Cruz, Madeline Cruz 09/22/2015 12:45 Date of Service: PM Medical Record [id number] Number: Patient Account Number: [id number] Date of Birth/Sex: 03-27-1920 (82 y.o. Female) Afful, RN, BSN, Treating RN: [place] Sink Primary Care [place], [place] Physician: BETH Other Clinician: Odelia Gage Treating Referring Physician: Evlyn Kanner BETH Physician/Extender: Tania Ade in Treatment: 8 Wound Status Wound Number: 1 Primary Pressure Ulcer Etiology: Wound Location: Left Foot - Lateral Wound Open Wounding Event: Pressure Injury Status: Date Acquired: 05/10/2015 Comorbid Cataracts, Asthma, Arrhythmia, History Weeks Of Treatment: 8 History: of pressure wounds, Osteoarthritis, Clustered Wound: No Dementia Photos Photo Uploaded By: Elpidio Eric on 09/22/2015 16:48:41 Wound Measurements Length: (cm) 3.5 Width: (cm) 1.8 Depth: (cm) 0.2 Area: (cm) 4.948 Volume: (cm) 0.99 % Reduction in Area: -5% % Reduction in Volume:  -5.1% Epithelialization: None Tunneling: No Undermining: No Wound Description Classification: Unstageable/Unclassified TIKITA, MABEE ([id number]) Foul Odor After Cleansing: No Wound Margin: Distinct, outline attached Exudate Amount: Large Exudate Type: Serosanguineous Exudate Color: red, brown Wound Bed Granulation Amount: Small (1-33%) Exposed Structure Granulation Quality: Pink, Pale Fascia Exposed: No Necrotic Amount: Large (67-100%) Fat Layer Exposed: No Necrotic Quality: Adherent Slough Tendon Exposed: No Muscle Exposed: No Joint Exposed: No Bone Exposed: No Limited to Skin Breakdown Periwound Skin Texture Texture Color No Abnormalities Noted: No No Abnormalities Noted: No Callus: No Atrophie Blanche: No Crepitus: No Cyanosis: No Excoriation: No Ecchymosis: No Fluctuance: No Erythema: No Friable: No Hemosiderin Staining: No Induration: No Mottled: No Localized Edema: Yes Pallor: No Rash: No Rubor: No Scarring: No Temperature / Pain Moisture Temperature: No Abnormality No Abnormalities Noted: No Tenderness on Palpation: Yes Dry / Scaly: No Maceration: Yes Moist: Yes Wound Preparation Ulcer Cleansing: Rinsed/Irrigated with Saline Topical Anesthetic Applied: Other: lidocaine 4%, Treatment Notes Wound #1 (Left, Lateral Foot) 1. Cleansed with: Clean wound with Normal Saline 3. Peri-wound Care: Skin Prep 4. Dressing Applied: Santyl Ointment 5. Secondary Dressing Applied SAMANTHAJO, PAYANO ([id number]) Bordered Foam Dressing Dry Gauze 6. Footwear/Offloading device applied Other footwear/offloading device applied (specify in notes) Notes sage bootS Electronic Signature(s) Signed: 09/22/2015 4:56:55 PM By: Elpidio Eric BSN, RN Entered By: Elpidio Eric on 09/22/2015 13:07:55 Madeline Cruz ([id number]) -------------------------------------------------------------------------------- Wound Assessment Details Patient Name: Madeline Cruz, Madeline Cruz 09/22/2015  12:45 Date of Service: PM Medical Record [id number] Number: Patient Account Number: [id number] Date of Birth/Sex: March 20, 1920 (82 y.o. Female) Afful, RN, BSN, Treating RN: [place] Sink Primary Care [place], [place] Physician: BETH Other Clinician: Odelia Gage Treating Referring Physician: Evlyn Kanner BETH Physician/Extender: Tania Ade in Treatment: 8 Wound Status Wound Number: 2 Primary Pressure Ulcer Etiology: Wound Location: Left Malleolus - Lateral Wound Open Wounding Event: Pressure Injury Status: Date Acquired: 05/10/2015 Comorbid Cataracts, Asthma, Arrhythmia, History Weeks Of Treatment: 8 History: of pressure wounds, Osteoarthritis, Clustered Wound: No Dementia Photos Photo Uploaded By: Elpidio Eric on 09/22/2015  16:48:41 Wound Measurements Length: (cm) 4 Width: (cm) 2 Depth: (cm) 0.2 Area: (cm) 6.283 Volume: (cm) 1.257 % Reduction in Area: -60% % Reduction in Volume: -60.1% Epithelialization: None Tunneling: No Undermining: No Wound Description Classification: Unstageable/Unclassified Madeline RoupUCKER, Carinne ([id number]) Foul Odor After Cleansing: No Wound Margin: Distinct, outline attached Exudate Amount: Large Exudate Type: Serosanguineous Exudate Color: red, brown Wound Bed Granulation Amount: Small (1-33%) Exposed Structure Granulation Quality: Red, Pink Fascia Exposed: No Necrotic Amount: Large (67-100%) Fat Layer Exposed: No Necrotic Quality: Adherent Slough Tendon Exposed: No Muscle Exposed: No Joint Exposed: No Bone Exposed: No Limited to Skin Breakdown Periwound Skin Texture Texture Color No Abnormalities Noted: No No Abnormalities Noted: No Callus: No Atrophie Blanche: No Crepitus: No Cyanosis: No Excoriation: No Ecchymosis: No Fluctuance: No Erythema: No Friable: No Hemosiderin Staining: No Induration: No Mottled: No Localized Edema: Yes Pallor: No Rash: No Rubor: No Scarring: No Temperature / Pain Moisture Temperature: No  Abnormality No Abnormalities Noted: No Tenderness on Palpation: Yes Dry / Scaly: No Maceration: Yes Moist: Yes Wound Preparation Ulcer Cleansing: Rinsed/Irrigated with Saline Topical Anesthetic Applied: Other: lidocaine 4%, Treatment Notes Wound #2 (Left, Lateral Malleolus) 1. Cleansed with: Clean wound with Normal Saline 3. Peri-wound Care: Skin Prep 4. Dressing Applied: Santyl Ointment 5. Secondary Dressing Applied Madeline RoupUCKER, Kingsley ([id number]) Bordered Foam Dressing Dry Gauze 6. Footwear/Offloading device applied Other footwear/offloading device applied (specify in notes) Notes sage bootS Electronic Signature(s) Signed: 09/22/2015 4:56:55 PM By: Elpidio EricAfful, Rita BSN, RN Entered By: Elpidio EricAfful, Rita on 09/22/2015 13:08:33 Madeline RoupUCKER, Kadynce ([id number]) -------------------------------------------------------------------------------- Wound Assessment Details Patient Name: Madeline RoupUCKER, Bhakti 09/22/2015 12:45 Date of Service: PM Medical Record [id number] Number: Patient Account Number: [id number] Date of Birth/Sex: 09-03-1919 (82 y.o. Female) Afful, RN, BSN, Treating RN: [place] Physician: BETH Other Clinician: Odelia GageMCGRANAGHAN, MARY Treating Referring Physician: Evlyn KannerBritto, Errol BETH Physician/Extender: Tania AdeWeeks in Treatment: 8 Wound Status Wound Number: 3 Primary Pressure Ulcer Etiology: Wound Location: Left Malleolus - Medial Wound Open Wounding Event: Gradually Appeared Status: Date Acquired: 09/11/2015 Comorbid Cataracts, Asthma, Arrhythmia, History Weeks Of Treatment: 1 History: of pressure wounds, Osteoarthritis, Clustered Wound: No Dementia Photos Photo Uploaded By: Elpidio EricAfful, Rita on 09/22/2015 16:48:42 Wound Measurements Length: (cm) 0.8 Width: (cm) 0.8 Depth: (cm) 0.3 Area: (cm) 0.503 Volume: (cm) 0.151 % Reduction in Area: 35.9% % Reduction in Volume: 36% Tunneling: No Undermining: No Wound Description Classification: Category/Stage  III Wound Margin: Distinct, outline attached Exudate Amount: Large Exudate Type: Serosanguineous Exudate Color: red, brown Foul Odor After Cleansing: No Wound Bed Granulation Amount: Small (1-33%) Exposed Structure Madeline RoupUCKER, Yomira ([id number]) Granulation Quality: Pink, Pale Fascia Exposed: No Necrotic Amount: Large (67-100%) Fat Layer Exposed: No Necrotic Quality: Adherent Slough Tendon Exposed: No Muscle Exposed: No Joint Exposed: No Bone Exposed: No Limited to Skin Breakdown Periwound Skin Texture Texture Color No Abnormalities Noted: No No Abnormalities Noted: No Callus: No Atrophie Blanche: No Crepitus: No Cyanosis: No Excoriation: No Ecchymosis: No Fluctuance: No Erythema: No Friable: No Hemosiderin Staining: No Induration: No Mottled: No Localized Edema: No Pallor: No Rash: No Rubor: No Scarring: No Temperature / Pain Moisture Temperature: No Abnormality No Abnormalities Noted: No Tenderness on Palpation: Yes Dry / Scaly: No Maceration: No Moist: Yes Wound Preparation Ulcer Cleansing: Rinsed/Irrigated with Saline Topical Anesthetic Applied: Other: lidocaine 4%, Treatment Notes Wound #3 (Left, Medial Malleolus) 1. Cleansed with: Clean wound with Normal Saline 3. Peri-wound Care: Skin Prep 4. Dressing Applied: Santyl Ointment 5. Secondary Dressing Applied Bordered Foam Dressing Dry Gauze 6. Footwear/Offloading device applied Other footwear/offloading  device applied (specify in notes) Notes sage bootS ARACELYS, GLADE ([id number]) Electronic Signature(s) Signed: 09/22/2015 4:56:55 PM By: Elpidio Eric BSN, RN Entered By: Elpidio Eric on 09/22/2015 13:09:07 Madeline Cruz ([id number]) -------------------------------------------------------------------------------- Vitals Details Patient Name: JAZYIAH, YIU 09/22/2015 12:45 Date of Service: PM Medical Record [id number] Number: Patient Account Number: [id number] Date of Birth/Sex: [birth year] (82  y.o. Female) Afful, RN, BSN, Treating RN: [place] Sink Primary Care [place], [place] Physician: BETH Other Clinician: Odelia Gage Treating Referring Physician: Lehman Prom Physician/Extender: Tania Ade in Treatment: 8 Vital Signs Time Taken: 13:05 Pulse (bpm): 59 Height (in): 59 Respiratory Rate (breaths/min): 17 Blood Pressure (mmHg): 148/66 Reference Range: 80 - 120 mg / dl Electronic Signature(s) Signed: 09/22/2015 4:56:55 PM By: Elpidio Eric BSN, RN Entered By: Elpidio Eric on 09/22/2015 13:09:42

## 2015-09-23 NOTE — Progress Notes (Signed)
RONIT, CRANFIELD ([id number]) Visit Report for 09/22/2015 Chief Complaint Document Details Patient Name: Madeline Cruz, Madeline Cruz 09/22/2015 12:45 Date of Service: PM Medical Record [id number] Number: Patient Account Number: [id number] Date of Birth/Sex: 1919/11/04 (82 y.o. Female) Afful, RN, BSN, Treating RN: [facility] Sink [facility] [place], [place] Physician: BETH Other Clinician: Odelia Gage Treating Referring Physician: Lehman Prom Physician/Extender: Tania Ade in Treatment: 8 Information Obtained from: Patient Chief Complaint Patient is at the clinic for treatment of an open pressure ulcer with a arterial competent and this has been there on the left foot since the last 3 months Electronic Signature(s) Signed: 09/22/2015 1:25:57 PM By: Evlyn Kanner MD, FACS Entered By: Evlyn Kanner on 09/22/2015 13:25:57 Madeline Cruz ([id number]) -------------------------------------------------------------------------------- HPI Details Patient Name: Madeline Cruz, Madeline Cruz 09/22/2015 12:45 Date of Service: PM Medical Record [id number] Number: Patient Account Number: [id number] Date of Birth/Sex: 10-31-1919 (82 y.o. Female) Afful, RN, BSN, Treating RN: [place] Sink Primary [facility] [place], [place] Physician: BETH Other Clinician: Odelia Gage Treating Referring Physician: Lehman Prom Physician/Extender: Tania Ade in Treatment: 8 History of Present Illness Location: left lateral foot Quality: Patient reports experiencing a sharp pain to affected area(s). Severity: Patient states wound are getting worse. Duration: Patient has had the wound for > 3 months prior to seeking treatment at the wound center Timing: Pain in wound is Intermittent (comes and goes Context: The wound appeared gradually over time Modifying Factors: Other treatment(s) tried include:local care and offloading Associated Signs and Symptoms: Patient reports having difficulty standing for long periods. HPI Description: 83 year old  patient who has history of advanced dementia and has been living at a nursing home for a long while. She was a smoker in the past and quit smoking at the age of 53 and has a past medical history of advanced dementia, hyponatremia, urinary tract infection, failure to thrive, macular degeneration and glaucoma. She is also status post appendectomy. 08/04/2015 -- had a left foot x-ray which shows no acute fracture or dislocation. There is no suspicious periostitis or cortical destruction 09/15/2015 -- the daughter has noticed increased swelling and some redness of the foot and as per her previous discussion she is not interested in getting any invasive procedure done. 09/22/2015 -- the daughter had several questions today regarding the possibility of placing a mother with hospice care versus doing an arterial duplex studies to see if a stent is going to help. At the same time she does not want any invasive procedure to be done. Electronic Signature(s) Signed: 09/22/2015 1:26:53 PM By: Evlyn Kanner MD, FACS Entered By: Evlyn Kanner on 09/22/2015 13:26:52 Madeline Cruz ([id number]) -------------------------------------------------------------------------------- Physical Exam Details Patient Name: Madeline Cruz, Madeline Cruz 09/22/2015 12:45 Date of Service: PM Medical Record [id number] Number: Patient Account Number: [id number] Date of Birth/Sex: 05-Mar-1920 (82 y.o. Female) Afful, RN, BSN, Treating RN: [place] [facility] [place], [place] Physician: BETH Other Clinician: Odelia Gage Treating Referring Physician: Lehman Prom Physician/Extender: Tania Ade in Treatment: 8 Constitutional . Pulse regular. Respirations normal and unlabored. Afebrile. . Eyes Nonicteric. Reactive to light. Ears, Nose, Mouth, and Throat Lips, teeth, and gums WNL.Marland Kitchen Moist mucosa without lesions. Neck supple and nontender. No palpable supraclavicular or cervical adenopathy. Normal sized without  goiter. Respiratory WNL. No retractions.. Cardiovascular Pedal Pulses WNL. No clubbing, cyanosis or edema. Lymphatic No adneopathy. No adenopathy. No adenopathy. Musculoskeletal Adexa without tenderness or enlargement.. Digits and nails w/o clubbing, cyanosis, infection, petechiae, ischemia, or inflammatory conditions.. Integumentary (Hair, Skin) No suspicious lesions. No crepitus or fluctuance. No peri-wound warmth or erythema. No masses.Marland Kitchen Psychiatric Judgement and insight Intact.Marland Kitchen No  evidence of depression, anxiety, or agitation.. Notes the new wound on the medial part of the left ankle continues to have a very small opening and some slough at the base which has not been debrided. The 2 lateral ankle wounds have less debris today and have some clean granulation tissue. No debridement was done today. Electronic Signature(s) Signed: 09/22/2015 1:27:55 PM By: Evlyn Kanner MD, FACS Entered By: Evlyn Kanner on 09/22/2015 13:27:54 Madeline Cruz ([id number]) -------------------------------------------------------------------------------- Physician Orders Details Patient Name: Madeline Cruz, Madeline Cruz 09/22/2015 12:45 Date of Service: PM Medical Record [id number] Number: Patient Account Number: [id number] Date of Birth/Sex: December 14, 1919 (82 y.o. Female) Afful, RN, BSN, Treating RN: [place] S[facility] [place], [place] Physician: BETH Other Clinician: Odelia Gage Treating Referring Physician: Lehman Prom Physician/Extender: Tania Ade in Treatment: 8 Verbal / Phone Orders: Yes Clinician: Afful, RN, BSN, Rita Read Back and Verified: Yes Diagnosis Coding Wound Cleansing Wound #1 Left,Lateral Foot o Clean wound with Normal Saline. Wound #2 Left,Lateral Malleolus o Clean wound with Normal Saline. Wound #3 Left,Medial Malleolus o Clean wound with Normal Saline. Anesthetic Wound #1 Left,Lateral Foot o Topical Lidocaine 4% cream applied to wound bed prior to  debridement Wound #2 Left,Lateral Malleolus o Topical Lidocaine 4% cream applied to wound bed prior to debridement Wound #3 Left,Medial Malleolus o Topical Lidocaine 4% cream applied to wound bed prior to debridement Skin Barriers/Peri-Wound Care Wound #1 Left,Lateral Foot o Skin Prep Wound #2 Left,Lateral Malleolus o Skin Prep Wound #3 Left,Medial Malleolus o Skin Prep Primary Wound Dressing Wound #1 Left,Lateral Foot o Santyl Ointment Madeline Cruz, Madeline Cruz ([id number]) Wound #2 Left,Lateral Malleolus o Santyl Ointment Wound #3 Left,Medial Malleolus o Santyl Ointment Secondary Dressing Wound #1 Left,Lateral Foot o Boardered Foam Dressing Wound #2 Left,Lateral Malleolus o Boardered Foam Dressing Wound #3 Left,Medial Malleolus o Boardered Foam Dressing Dressing Change Frequency Wound #1 Left,Lateral Foot o Change dressing every day. Wound #2 Left,Lateral Malleolus o Change dressing every day. Wound #3 Left,Medial Malleolus o Change dressing every day. Follow-up Appointments Wound #1 Left,Lateral Foot o Return Appointment in 1 week. Wound #2 Left,Lateral Malleolus o Return Appointment in 1 week. Off-Loading Wound #1 Left,Lateral Foot o Heel suspension boot to: - SAGE BOOTS Wound #2 Left,Lateral Malleolus o Heel suspension boot to: - SAGE BOOTS Wound #3 Left,Medial Malleolus o Heel suspension boot to: - SAGE BOOTS Additional Orders / Instructions Wound #1 Left,Lateral Foot o Increase protein intake. Madeline Cruz, Madeline Cruz ([id number]) o Other: - ZInc oxide. Madeline Cruz, Madeline Cruz Wound #2 Left,Lateral Malleolus o Increase protein intake. - ZInc oxide. Madeline Cruz, Madeline Cruz Wound #3 Left,Medial Malleolus o Increase protein intake. o Other: - ZInc oxide. Madeline Cruz, Madeline Cruz Home Health Wound #1 Left,Lateral Foot o Continue Home Health Visits - [facility] o Home Health Nurse may visit PRN to address patientos wound  care needs. o FACE TO FACE ENCOUNTER: MEDICARE and MEDICAID PATIENTS: I certify that this patient is under my care and that I had a face-to-face encounter that meets the physician face-to-face encounter requirements with this patient on this date. The encounter with the patient was in whole or in part for the following MEDICAL CONDITION: (primary reason for Home Healthcare) MEDICAL NECESSITY: I certify, that based on my findings, NURSING services are a medically necessary home health service. HOME BOUND STATUS: I certify that my clinical findings support that this patient is homebound (i.e., Due to illness or injury, pt requires aid of supportive devices such as crutches, cane, wheelchairs, walkers, the use of special transportation or the  assistance of another person to leave their place of residence. There is a normal inability to leave the home and doing so requires considerable and taxing effort. Other absences are for medical reasons / religious services and are infrequent or of short duration when for other reasons). o If current dressing causes regression in wound condition, may D/Cruz ordered dressing product/s and apply Normal Saline Moist Dressing daily until next Wound Healing Center / Other MD appointment. Notify Wound Healing Center of regression in wound condition at [phone]. o Please direct any NON-WOUND related issues/requests for orders to patient's Primary Care Physician Wound #2 Left,Lateral Malleolus o Continue Home Health Visits - [facility] o Home Health Nurse may visit PRN to address patientos wound care needs. o FACE TO FACE ENCOUNTER: MEDICARE and MEDICAID PATIENTS: I certify that this patient is under my care and that I had a face-to-face encounter that meets the physician face-to-face encounter requirements with this patient on this date. The encounter with the patient was in whole or in part for the following MEDICAL  CONDITION: (primary reason for Home Healthcare) MEDICAL NECESSITY: I certify, that based on my findings, NURSING services are a medically necessary home health service. HOME BOUND STATUS: I certify that my clinical findings support that this patient is homebound (i.e., Due to illness or injury, pt requires aid of supportive devices such as crutches, cane, wheelchairs, walkers, the use of special transportation or the assistance of another person to leave their place of residence. There is a normal inability to leave the home and doing so requires considerable and taxing effort. Other absences are for medical reasons / religious services and are infrequent or of short duration when for other reasons). o If current dressing causes regression in wound condition, may D/Cruz ordered dressing product/s and apply Normal Saline Moist Dressing daily until next Wound Healing Center / Other MD appointment. Notify Wound Healing Center of regression in wound condition at [phone]. Madeline Cruz, Madeline Cruz ([id number]) o Please direct any NON-WOUND related issues/requests for orders to patient's Primary Care Physician Medications-please add to medication list. Wound #1 Left,Lateral Foot o P.O. Antibiotics - doxycycline 100mg  BID x 14 days o Santyl Enzymatic Ointment o Other: - ZINC, Madeline Cruz, Madeline Cruz Wound #2 Left,Lateral Malleolus o P.O. Antibiotics - doxycycline 100mg  BID x 14 days o Santyl Enzymatic Ointment o Other: - ZINC, Madeline Cruz, Madeline Cruz Wound #3 Left,Medial Malleolus o P.O. Antibiotics - doxycycline 100mg  BID x 14 days o Santyl Enzymatic Ointment o Other: - ZINC, Madeline Cruz, Madeline Cruz Electronic Signature(s) Signed: 09/22/2015 4:23:46 PM By: Evlyn KannerBritto, Liviana Mills MD, FACS Signed: 09/22/2015 4:56:55 PM By: Elpidio EricAfful, Rita BSN, RN Entered By: Elpidio EricAfful, Rita on 09/22/2015 13:18:11 Madeline Cruz, Madeline Cruz ([id number]) -------------------------------------------------------------------------------- Prescription  09/22/2015 Patient Name: Madeline Cruz, Madeline Cruz Physician: Evlyn KannerBritto, Bawi Lakins MD Date of Birth: 1920-02-21 NPI#: [id number] [phone] Sex: F DEA#: [id number] Phone #: [phone] License #: Patient Address[facility] [address] [facility] [place] [id number] [address], [address] [place], [place] [address] [phone] Allergies sulfa Antibiotics Physician's Orders Santyl Enzymatic Ointment Signature(s): Date(s): Electronic Signature(s) Signed: 09/22/2015 4:23:46 PM By: Evlyn KannerBritto, Milarose Savich MD, FACS Signed: 09/22/2015 4:56:55 PM By: Elpidio EricAfful, Rita BSN, RN Entered By: Elpidio EricAfful, Rita on 09/22/2015 13:18:12 Madeline Cruz, Madeline Cruz ([id number]) --------------------------------------------------------------------------------  Problem List Details Patient Name: Madeline Cruz, Cristina 09/22/2015 12:45 Date of Service: PM Medical Record [id number] Number: Patient Account Number: [id number] Date of Birth/Sex: 1920-02-21 (82 y.o. Female) Afful, RN, BSN, Treating RN: [place] Sinkita  Primary Care [facility], [place] Physician: BETH Other Clinician: Odelia Gage Treating Referring Physician: Lehman Prom Physician/Extender: Tania Ade in Treatment: 8 Active Problems ICD-10 Encounter Code Description Active Date Diagnosis L89.523 Pressure ulcer of left ankle, stage 3 07/28/2015 Yes G30.1 Alzheimer's disease with late onset 07/28/2015 Yes Z87.891 Personal history of nicotine dependence 07/28/2015 Yes I70.245 Atherosclerosis of native arteries of left leg with ulceration 07/28/2015 Yes of other part of foot Inactive Problems Resolved Problems Electronic Signature(s) Signed: 09/22/2015 1:25:50 PM By: Evlyn Kanner MD, FACS Entered By: Evlyn Kanner on 09/22/2015 13:25:50 Madeline Cruz ([id number]) -------------------------------------------------------------------------------- Progress Note Details Patient Name: FREYA, ZOBRIST 09/22/2015  12:45 Date of Service: PM Medical Record [id number] Number: Patient Account Number: [id number] Date of Birth/Sex: [birth year] (82 y.o. Female) Afful, RN, BSN, Treating RN: [place] Sink Primary [facility] [place], [place] Physician: BETH Other Clinician: Odelia Gage Treating Referring Physician: Lehman Prom Physician/Extender: Tania Ade in Treatment: 8 Subjective Chief Complaint Information obtained from Patient Patient is at the clinic for treatment of an open pressure ulcer with a arterial competent and this has been there on the left foot since the last 3 months History of Present Illness (HPI) The following HPI elements were documented for the patient's wound: Location: left lateral foot Quality: Patient reports experiencing a sharp pain to affected area(s). Severity: Patient states wound are getting worse. Duration: Patient has had the wound for > 3 months prior to seeking treatment at the wound center Timing: Pain in wound is Intermittent (comes and goes Context: The wound appeared gradually over time Modifying Factors: Other treatment(s) tried include:local care and offloading Associated Signs and Symptoms: Patient reports having difficulty standing for long periods. 82 year old patient who has history of advanced dementia and has been living at a nursing home for a long while. She was a smoker in the past and quit smoking at the age of 57 and has a past medical history of advanced dementia, hyponatremia, urinary tract infection, failure to thrive, macular degeneration and glaucoma. She is also status post appendectomy. 08/04/2015 -- had a left foot x-ray which shows no acute fracture or dislocation. There is no suspicious periostitis or cortical destruction 09/15/2015 -- the daughter has noticed increased swelling and some redness of the foot and as per her previous discussion she is not interested in getting any invasive procedure done. 09/22/2015 -- the daughter had several  questions today regarding the possibility of placing a mother with hospice care versus doing an arterial duplex studies to see if a stent is going to help. At the same time she does not want any invasive procedure to be done. Objective TEONNA, COONAN ([id number]) Constitutional Pulse regular. Respirations normal and unlabored. Afebrile. Vitals Time Taken: 1:05 PM, Height: 59 in, Pulse: 59 bpm, Respiratory Rate: 17 breaths/min, Blood Pressure: 148/66 mmHg. Eyes Nonicteric. Reactive to light. Ears, Nose, Mouth, and Throat Lips, teeth, and gums WNL.Marland Kitchen Moist mucosa without lesions. Neck supple and nontender. No palpable supraclavicular or cervical adenopathy. Normal sized without goiter. Respiratory WNL. No retractions.. Cardiovascular Pedal Pulses WNL. No clubbing, cyanosis or edema. Lymphatic No adneopathy. No adenopathy. No adenopathy. Musculoskeletal Adexa without tenderness or enlargement.. Digits and nails w/o clubbing, cyanosis, infection, petechiae, ischemia, or inflammatory conditions.Marland Kitchen Psychiatric Judgement and insight Intact.. No evidence of depression, anxiety, or agitation.. General Notes: the new wound on the medial part of the left ankle continues to have a very small opening and some slough at the base which has not been debrided. The 2 lateral ankle wounds have less debris today and have  some clean granulation tissue. No debridement was done today. Integumentary (Hair, Skin) No suspicious lesions. No crepitus or fluctuance. No peri-wound warmth or erythema. No masses.. Wound #1 status is Open. Original cause of wound was Pressure Injury. The wound is located on the Left,Lateral Foot. The wound measures 3.5cm length x 1.8cm width x 0.2cm depth; 4.948cm^2 area and 0.99cm^3 volume. The wound is limited to skin breakdown. There is no tunneling or undermining noted. There is a large amount of serosanguineous drainage noted. The wound margin is distinct with the  outline attached to the wound base. There is small (1-33%) pink, pale granulation within the wound bed. There is a large (67-100%) amount of necrotic tissue within the wound bed including Adherent Slough. The periwound skin appearance exhibited: Localized Edema, Maceration, Moist. The periwound skin appearance did not exhibit: Callus, Crepitus, Excoriation, Fluctuance, Friable, Induration, Rash, Scarring, Dry/Scaly, Atrophie Blanche, Cyanosis, Ecchymosis, Hemosiderin Staining, Mottled, Pallor, Rubor, Erythema. Periwound temperature was noted as No Abnormality. The periwound has tenderness on palpation. SIDNEE, GAMBRILL ([id number]) Wound #2 status is Open. Original cause of wound was Pressure Injury. The wound is located on the Left,Lateral Malleolus. The wound measures 4cm length x 2cm width x 0.2cm depth; 6.283cm^2 area and 1.257cm^3 volume. The wound is limited to skin breakdown. There is no tunneling or undermining noted. There is a large amount of serosanguineous drainage noted. The wound margin is distinct with the outline attached to the wound base. There is small (1-33%) red, pink granulation within the wound bed. There is a large (67-100%) amount of necrotic tissue within the wound bed including Adherent Slough. The periwound skin appearance exhibited: Localized Edema, Maceration, Moist. The periwound skin appearance did not exhibit: Callus, Crepitus, Excoriation, Fluctuance, Friable, Induration, Rash, Scarring, Dry/Scaly, Atrophie Blanche, Cyanosis, Ecchymosis, Hemosiderin Staining, Mottled, Pallor, Rubor, Erythema. Periwound temperature was noted as No Abnormality. The periwound has tenderness on palpation. Wound #3 status is Open. Original cause of wound was Gradually Appeared. The wound is located on the Left,Medial Malleolus. The wound measures 0.8cm length x 0.8cm width x 0.3cm depth; 0.503cm^2 area and 0.151cm^3 volume. The wound is limited to skin breakdown. There is no tunneling  or undermining noted. There is a large amount of serosanguineous drainage noted. The wound margin is distinct with the outline attached to the wound base. There is small (1-33%) pink, pale granulation within the wound bed. There is a large (67-100%) amount of necrotic tissue within the wound bed including Adherent Slough. The periwound skin appearance exhibited: Moist. The periwound skin appearance did not exhibit: Callus, Crepitus, Excoriation, Fluctuance, Friable, Induration, Localized Edema, Rash, Scarring, Dry/Scaly, Maceration, Atrophie Blanche, Cyanosis, Ecchymosis, Hemosiderin Staining, Mottled, Pallor, Rubor, Erythema. Periwound temperature was noted as No Abnormality. The periwound has tenderness on palpation. Assessment Active Problems ICD-10 L89.523 - Pressure ulcer of left ankle, stage 3 G30.1 - Alzheimer's disease with late onset Z87.891 - Personal history of nicotine dependence I70.245 - Atherosclerosis of native arteries of left leg with ulceration of other part of foot Plan Wound Cleansing: Wound #1 Left,Lateral Foot: Clean wound with Normal Saline. Wound #2 Left,Lateral Malleolus: Clean wound with Normal Saline. Wound #3 Left,Medial Malleolus: Madeline Cruz ([id number]) Clean wound with Normal Saline. Anesthetic: Wound #1 Left,Lateral Foot: Topical Lidocaine 4% cream applied to wound bed prior to debridement Wound #2 Left,Lateral Malleolus: Topical Lidocaine 4% cream applied to wound bed prior to debridement Wound #3 Left,Medial Malleolus: Topical Lidocaine 4% cream applied to wound bed prior to debridement Skin Barriers/Peri-Wound Care: Wound #1 Left,Lateral  Foot: Skin Prep Wound #2 Left,Lateral Malleolus: Skin Prep Wound #3 Left,Medial Malleolus: Skin Prep Primary Wound Dressing: Wound #1 Left,Lateral Foot: Santyl Ointment Wound #2 Left,Lateral Malleolus: Santyl Ointment Wound #3 Left,Medial Malleolus: Santyl Ointment Secondary Dressing: Wound #1  Left,Lateral Foot: Boardered Foam Dressing Wound #2 Left,Lateral Malleolus: Boardered Foam Dressing Wound #3 Left,Medial Malleolus: Boardered Foam Dressing Dressing Change Frequency: Wound #1 Left,Lateral Foot: Change dressing every day. Wound #2 Left,Lateral Malleolus: Change dressing every day. Wound #3 Left,Medial Malleolus: Change dressing every day. Follow-up Appointments: Wound #1 Left,Lateral Foot: Return Appointment in 1 week. Wound #2 Left,Lateral Malleolus: Return Appointment in 1 week. Off-Loading: Wound #1 Left,Lateral Foot: Heel suspension boot to: - SAGE BOOTS Wound #2 Left,Lateral Malleolus: Heel suspension boot to: - SAGE BOOTS Wound #3 Left,Medial Malleolus: Heel suspension boot to: - SAGE BOOTS Additional Orders / Instructions: Wound #1 Left,Lateral Foot: Increase protein intake. ANIYAH, NOBIS ([id number]) Other: - ZInc oxide. Madeline Cruz, Madeline Cruz Wound #2 Left,Lateral Malleolus: Increase protein intake. - ZInc oxide. Madeline Cruz, Madeline Cruz Wound #3 Left,Medial Malleolus: Increase protein intake. Other: - ZInc oxide. Madeline Cruz, Madeline Cruz Home Health: Wound #1 Left,Lateral Foot: Continue [facility] Visits - [facility] Home Health Nurse may visit PRN to address patient s wound care needs. FACE TO FACE ENCOUNTER: MEDICARE and MEDICAID PATIENTS: I certify that this patient is under my care and that I had a face-to-face encounter that meets the physician face-to-face encounter requirements with this patient on this date. The encounter with the patient was in whole or in part for the following MEDICAL CONDITION: (primary reason for Home Healthcare) MEDICAL NECESSITY: I certify, that based on my findings, NURSING services are a medically necessary home health service. HOME BOUND STATUS: I certify that my clinical findings support that this patient is homebound (i.e., Due to illness or injury, pt requires aid of supportive devices such as crutches,  cane, wheelchairs, walkers, the use of special transportation or the assistance of another person to leave their place of residence. There is a normal inability to leave the home and doing so requires considerable and taxing effort. Other absences are for medical reasons / religious services and are infrequent or of short duration when for other reasons). If current dressing causes regression in wound condition, may D/Cruz ordered dressing product/s and apply Normal Saline Moist Dressing daily until next Wound Healing Center / Other MD appointment. Notify Wound Healing Center of regression in wound condition at [phone]. Please direct any NON-WOUND related issues/requests for orders to patient's Primary Care Physician Wound #2 Left,Lateral Malleolus: Continue Home Health Visits - [facility] Home Health Nurse may visit PRN to address patient s wound care needs. FACE TO FACE ENCOUNTER: MEDICARE and MEDICAID PATIENTS: I certify that this patient is under my care and that I had a face-to-face encounter that meets the physician face-to-face encounter requirements with this patient on this date. The encounter with the patient was in whole or in part for the following MEDICAL CONDITION: (primary reason for Home Healthcare) MEDICAL NECESSITY: I certify, that based on my findings, NURSING services are a medically necessary home health service. HOME BOUND STATUS: I certify that my clinical findings support that this patient is homebound (i.e., Due to illness or injury, pt requires aid of supportive devices such as crutches, cane, wheelchairs, walkers, the use of special transportation or the assistance of another person to leave their place of residence. There is a normal inability to leave the home and  doing so requires considerable and taxing effort. Other absences are for medical reasons / religious services and are infrequent or of short duration when for other  reasons). If current dressing causes regression in wound condition, may D/Cruz ordered dressing product/s and apply Normal Saline Moist Dressing daily until next Wound Healing Center / Other MD appointment. Notify Wound Healing Center of regression in wound condition at [phone]. Please direct any NON-WOUND related issues/requests for orders to patient's Primary Care Physician Medications-please add to medication list.: Wound #1 Left,Lateral Foot: P.O. Antibiotics - doxycycline 100mg  BID x 14 days Santyl Enzymatic Ointment Other: - ZINC, Madeline Cruz, Madeline Cruz Wound #2 Left,Lateral Malleolus: P.O. Antibiotics - doxycycline 100mg  BID x 14 days Santyl Enzymatic Ointment Other: - ZINC, Madeline Cruz, Madeline Cruz Wound #3 Left,Medial Malleolus: Madeline Cruz ([id number]) P.O. Antibiotics - doxycycline 100mg  BID x 14 days Santyl Enzymatic Ointment Other: - ZINC, Madeline Cruz, Madeline Cruz The cellulitis noticed last week is better.Due to the family's wishes of not wanting any invasive procedure to be done I think it's reasonable to continue with Santyl ointment locally and last week I empirically put her on doxycycline 100 mg twice a day for 2 weeks. The daughter was at the bedside has had all questions answered and she had said several questions regarding hospice care versus intervention with stents and has finally come to a conclusion that she will continue conservative care. Electronic Signature(s) Signed: 09/22/2015 1:29:59 PM By: Evlyn Kanner MD, FACS Entered By: Evlyn Kanner on 09/22/2015 13:29:59 Madeline Cruz ([id number]) -------------------------------------------------------------------------------- SuperBill Details Patient Name: Madeline Cruz Date of Service: 09/22/2015 Medical Record Patient Account Number: [id number] Number: Afful, RN, BSN, Treating RN: Date of Birth/Sex: 07-29-1919 (82 y.o. Female) [facility], [place] Other Clinician: Physician: BETH Treating Jax Kentner,  Threasa Alpha, MARY Physician/Extender: Referring Physician: Derald Macleod in Treatment: 8 Diagnosis Coding ICD-10 Codes Code Description [phone] Pressure ulcer of left ankle, stage 3 G30.1 Alzheimer's disease with late onset Z87.891 Personal history of nicotine dependence I70.245 Atherosclerosis of native arteries of left leg with ulceration of other part of foot Facility Procedures CPT4 Code: [id number] Description: 99214 - WOUND CARE VISIT-LEV 4 EST PT Modifier: Quantity: 1 Physician Procedures CPT4: Description Modifier Quantity Code [id number] 99213 - WC PHYS LEVEL 3 - EST PT 1 ICD-10 Description Diagnosis L89.523 Pressure ulcer of left ankle, stage 3 G30.1 Alzheimer's disease with late onset Z87.891 Personal history of nicotine dependence  I70.245 Atherosclerosis of native arteries of left leg with ulceration of other part of foot Electronic Signature(s) Signed: 09/22/2015 1:30:19 PM By: Evlyn Kanner MD, FACS Entered By: Evlyn Kanner on 09/22/2015 13:30:19

## 2015-09-29 ENCOUNTER — Ambulatory Visit: Payer: Medicare Other | Admitting: Surgery

## 2015-10-06 ENCOUNTER — Ambulatory Visit: Payer: Medicare Other | Admitting: Surgery

## 2015-10-13 ENCOUNTER — Ambulatory Visit: Payer: Medicare Other | Admitting: Surgery

## 2015-10-20 ENCOUNTER — Ambulatory Visit: Payer: Medicare Other | Admitting: Surgery

## 2017-01-25 DEATH — deceased
